# Patient Record
Sex: Male | Born: 1990 | Hispanic: No | Marital: Single
Health system: Midwestern US, Community
[De-identification: ages and names within clinical notes are randomized; demographics above are authoritative.]

## PROBLEM LIST (undated history)

## (undated) DIAGNOSIS — Z992 Dependence on renal dialysis: Secondary | ICD-10-CM

## (undated) DIAGNOSIS — I10 Essential (primary) hypertension: Secondary | ICD-10-CM

## (undated) DIAGNOSIS — N186 End stage renal disease: Secondary | ICD-10-CM

## (undated) DIAGNOSIS — T82868A Thrombosis of vascular prosthetic devices, implants and grafts, initial encounter: Secondary | ICD-10-CM

## (undated) DIAGNOSIS — I160 Hypertensive urgency: Secondary | ICD-10-CM

## (undated) DIAGNOSIS — T8612 Kidney transplant failure: Secondary | ICD-10-CM

---

## 2015-08-06 ENCOUNTER — Emergency Department: Payer: Medicare Other

## 2015-08-06 ENCOUNTER — Encounter: Payer: Self-pay | Admitting: Urgent Care

## 2015-08-06 ENCOUNTER — Emergency Department
Admission: EM | Admit: 2015-08-06 | Discharge: 2015-08-06 | Disposition: A | Discharge status: Home / Self Care | Payer: Medicare Other | Attending: Emergency Medicine | Admitting: Emergency Medicine

## 2015-08-06 DIAGNOSIS — N186 End stage renal disease: Secondary | ICD-10-CM | POA: Insufficient documentation

## 2015-08-06 DIAGNOSIS — I120 Hypertensive chronic kidney disease with stage 5 chronic kidney disease or end stage renal disease: Secondary | ICD-10-CM | POA: Insufficient documentation

## 2015-08-06 DIAGNOSIS — Z992 Dependence on renal dialysis: Secondary | ICD-10-CM | POA: Insufficient documentation

## 2015-08-06 DIAGNOSIS — R1033 Periumbilical pain: Secondary | ICD-10-CM | POA: Diagnosis present

## 2015-08-06 HISTORY — DX: Essential (primary) hypertension: I10

## 2015-08-06 HISTORY — DX: Dependence on renal dialysis: Z99.2

## 2015-08-06 HISTORY — DX: End stage renal disease: N18.6

## 2015-08-06 LAB — COMPREHENSIVE METABOLIC PANEL
ALT: 10 U/L — AB (ref 17–63)
ANION GAP: 12 (ref 5–15)
AST: 23 U/L (ref 15–41)
Albumin: 3.8 g/dL (ref 3.5–5.0)
Alkaline Phosphatase: 55 U/L (ref 38–126)
BUN: 42 mg/dL — ABNORMAL HIGH (ref 6–20)
CHLORIDE: 103 mmol/L (ref 101–111)
CO2: 23 mmol/L (ref 22–32)
CREATININE: 13.02 mg/dL — AB (ref 0.61–1.24)
Calcium: 8.9 mg/dL (ref 8.9–10.3)
GFR, EST AFRICAN AMERICAN: 5 mL/min — AB (ref >60)
GFR, EST NON AFRICAN AMERICAN: 5 mL/min — AB (ref >60)
Glucose, Bld: 93 mg/dL (ref 65–99)
Potassium: 5 mmol/L (ref 3.5–5.1)
SODIUM: 138 mmol/L (ref 135–145)
TOTAL PROTEIN: 7 g/dL (ref 6.5–8.1)
Total Bilirubin: 1 mg/dL (ref 0.3–1.2)

## 2015-08-06 LAB — CBC
HCT: 36.3 % — ABNORMAL LOW (ref 40.0–52.0)
Hemoglobin: 11.6 g/dL — ABNORMAL LOW (ref 13.0–18.0)
MCH: 30.2 pg (ref 26.0–34.0)
MCHC: 32.1 g/dL (ref 32.0–36.0)
MCV: 94.1 fL (ref 80.0–100.0)
PLATELETS: 208 10*3/uL (ref 150–440)
RBC: 3.85 MIL/uL — AB (ref 4.40–5.90)
RDW: 17.6 % — AB (ref 11.5–14.5)
WBC: 6.6 10*3/uL (ref 3.8–10.6)

## 2015-08-06 LAB — LIPASE, BLOOD: LIPASE: 25 U/L (ref 11–51)

## 2015-08-06 MED ORDER — MORPHINE SULFATE (PF) 4 MG/ML IV SOLN
INTRAVENOUS | Status: AC
  Filled 2015-08-06: qty 1

## 2015-08-06 MED ORDER — ONDANSETRON HCL 4 MG/2ML IJ SOLN
4.0000 mg | Freq: Once | INTRAMUSCULAR | Status: AC
  Administered 2015-08-06: 4 mg via INTRAVENOUS

## 2015-08-06 MED ORDER — ONDANSETRON HCL 4 MG/2ML IJ SOLN
INTRAMUSCULAR | Status: AC
  Filled 2015-08-06: qty 2

## 2015-08-06 MED ORDER — HYDROCODONE-ACETAMINOPHEN 5-325 MG PO TABS: 1.0000 | ORAL_TABLET | ORAL | Status: AC | PRN

## 2015-08-06 MED ORDER — MORPHINE SULFATE (PF) 4 MG/ML IV SOLN
4.0000 mg | Freq: Once | INTRAVENOUS | Status: AC
  Administered 2015-08-06: 4 mg via INTRAVENOUS

## 2015-08-06 MED ORDER — SIMETHICONE 80 MG PO CHEW: 80.0000 mg | CHEWABLE_TABLET | Freq: Four times a day (QID) | ORAL | Status: AC | PRN

## 2015-08-06 NOTE — ED Notes (Signed)
Patient transported to CT 

## 2015-08-06 NOTE — ED Provider Notes (Signed)
[facility] Emergency Department Provider Note  Time seen: 5:23 AM  I have reviewed the triage vital signs and the nursing notes.   HISTORY  Chief Complaint Abdominal Pain    HPI Jeff Joseph is a 25 y.o. male with a past medical history of hypertension, end-stage renal disease on hemodialysis Monday, Wednesday, Friday, presents the emergency department with abdominal pain. According to the patient for the past 3 days he has been experiencing periumbilical abdominal pain. Patient states he believes it's gas but it has not come away so he became concerned and came to the emergency department. Patient states he also felt like his blood pressure was elevated so he took his blood pressure medications just before arrival to the emergency department. Currently describes his pain as moderate and dull pain to the center of his abdomen denies nausea, vomiting, diarrhea. States he makes urine almost every day, denies dysuria. Denies fever.     Past Medical History  Diagnosis Date  . ESRD (end stage renal disease) on dialysis (HCC)   . Hemodialysis patient (HCC)     M,W,F  . Hypertension     There are no active problems to display for this patient.   History reviewed. No pertinent past surgical history.  No current outpatient prescriptions on file.  Allergies Review of patient's allergies indicates not on file.  No family history on file.  Social History Social History  Substance Use Topics  . Smoking status: Never Smoker   . Smokeless tobacco: None  . Alcohol Use: No    Review of Systems Constitutional: Negative for fever. Cardiovascular: Negative for chest pain. Respiratory: Negative for shortness of breath. Gastrointestinal: Positive for periumbilical abdominal pain Genitourinary: Negative for dysuria. Neurological: Negative for headache 10-point ROS otherwise negative.  ____________________________________________   PHYSICAL  EXAM:  VITAL SIGNS: ED Triage Vitals  Enc Vitals Group     BP 08/06/15 0510 200/136 mmHg     Pulse Rate 08/06/15 0510 86     Resp 08/06/15 0510 18     Temp 08/06/15 0510 98.6 F (37 C)     Temp Source 08/06/15 0510 Oral     SpO2 08/06/15 0510 100 %     Weight 08/06/15 0516 215 lb (97.523 kg)     Height 08/06/15 0516  (1.88 m)     Head Cir --      Peak Flow --      Pain Score 08/06/15 0510 8     Pain Loc --      Pain Edu? --      Excl. in GC? --     Constitutional: Alert and oriented. Well appearing and in no distress. Eyes: Normal exam ENT   Head: Normocephalic and atraumatic.   Mouth/Throat: Mucous membranes are moist. Cardiovascular: Normal rate, regular rhythm. No murmur Respiratory: Normal respiratory effort without tachypnea nor retractions. Breath sounds are clear  Gastrointestinal: Soft and nontender. No distention.   Musculoskeletal: Nontender with normal range of motion in all extremities.  Neurologic:  Normal speech and language. No gross focal neurologic deficits Skin:  Skin is warm, dry and intact.  Psychiatric: Mood and affect are normal.  ____________________________________________   RADIOLOGY  CT scan shows no acute abnormality.  ____________________________________________    INITIAL IMPRESSION / ASSESSMENT AND PLAN / ED COURSE  Pertinent labs & imaging results that were available during my care of the patient were reviewed by me and considered in my medical decision making (see chart for details).  Patient presents the emergency department periumbilical abdominal pain for the past 3 days. Patient states he believes his gas but has not gone away so he became concerned. On exam the patient appears well, no distress, no tenderness to palpation. We will check labs, and proceed with a CT abdomen/pelvis without contrast to further evaluate.  Labs are largely the patient's baseline. CT scan shows no acute abnormality. Patient's blood pressure  is decreasing. We will obtain a right upper quadrant ultrasound to rule out biliary disease. Patient resting comfortably at this time. Patient care signed out to Dr. Derrill KayGoodman.  ____________________________________________   FINAL CLINICAL IMPRESSION(S) / ED DIAGNOSES  Abdominal pain   Minna AntisKevin Guilford Shannahan, MD 08/06/15 [phone]

## 2015-08-06 NOTE — ED Notes (Signed)
MD Paduchowski at bedside  

## 2015-08-06 NOTE — ED Notes (Signed)
MD notified of patient's pain score and patient's BP. Pt has been consistently hypertensive since his arrival to the ED. Per MD pt is okay for D/C at this time. Pt's family at bedside to take patient home. NAD noted at time of D/C. Respirations even and unlabored at this time, skin warm, dry, and intact. Pt denies comments/concerns/questions at this time.

## 2015-08-06 NOTE — ED Notes (Signed)
Patient presents with c/o periumbilical pain since Thursday. Denies accompanying symptoms. Patient states, "It feels like gas, but I cant pass any so I dont know".

## 2015-08-06 NOTE — ED Notes (Signed)
Pt taken to US at this time

## 2015-08-06 NOTE — Discharge Instructions (Signed)

## 2015-08-06 NOTE — ED Notes (Signed)
Pt on MWF dialysis schedule. Fistula in left arm

## 2015-08-06 NOTE — ED Notes (Signed)
Pt returned from US

## 2015-08-06 NOTE — ED Provider Notes (Signed)
-----------------------------------------   8:06 AM on 08/06/2015 -----------------------------------------  Ultrasound returned without any acute findings. I went and reevaluated patient. He's denied any further complaints or concerns at this time. I did discuss plan of care and discuss return precautions. Will discharge with paperwork prepared by Dr. Lenard LancePaduchowski.  Jeff Joseph Lila Lufkin, MD 08/06/15 [phone]

## 2017-03-09 IMAGING — CT CT RENAL STONE PROTOCOL
3 of 4 series · 9 of 46 positions shown, 16 images · non-contrast
Comparison: None.

CLINICAL DATA: Central to right flank pain for 2 days.

EXAM:
CT ABDOMEN AND PELVIS WITHOUT CONTRAST
TECHNIQUE: Multidetector CT imaging of the abdomen and pelvis was performed
following the standard protocol without IV contrast.

[Series 4: lung · axial · 0.75mm/px · z∈[-188,-98]mm · 5 of 28 slices shown, 10 images]
[im 5/28  soft-tissue]
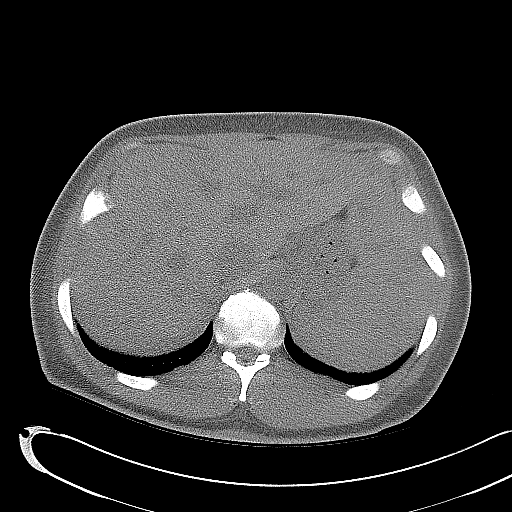
[im 5/28  bone]
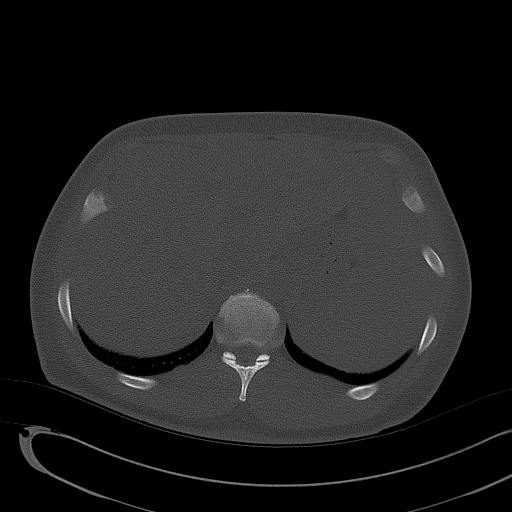
[im 10/28  soft-tissue]
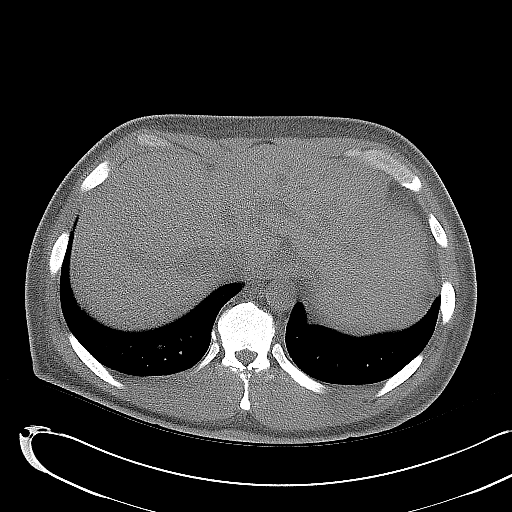
[im 10/28  lung]
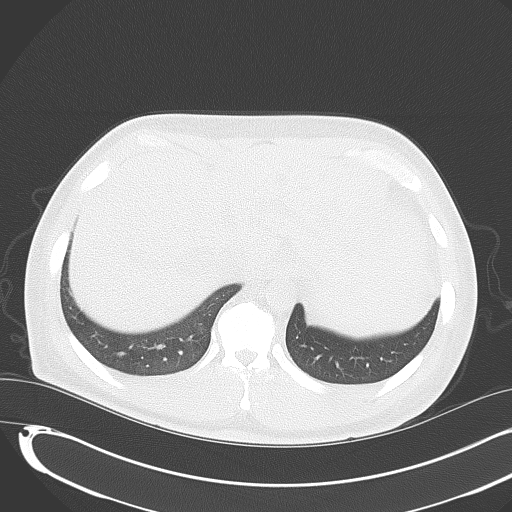
[im 14/28  soft-tissue]
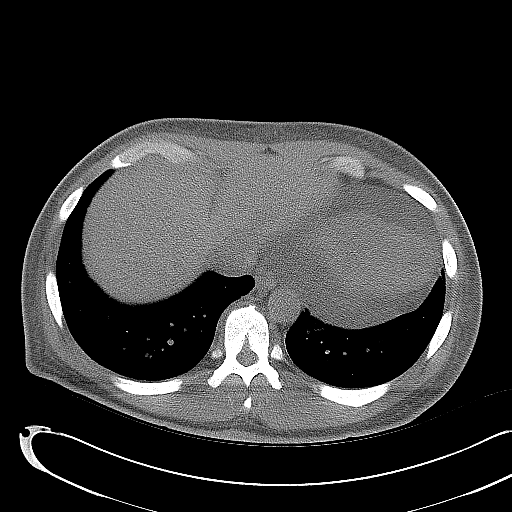
[im 14/28  lung]
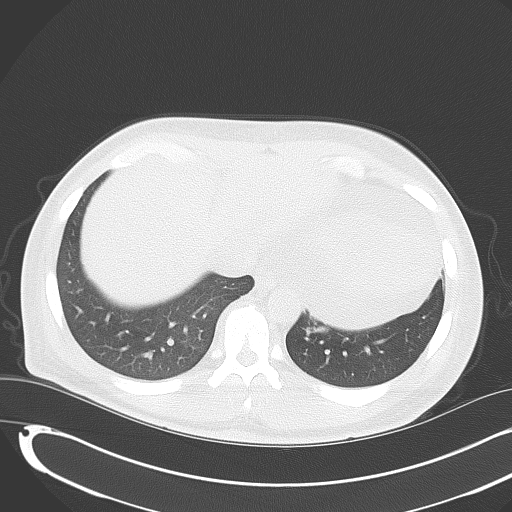
[im 19/28  soft-tissue]
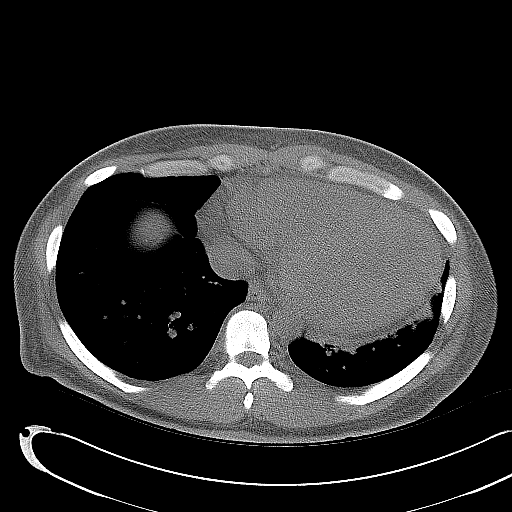
[im 19/28  lung]
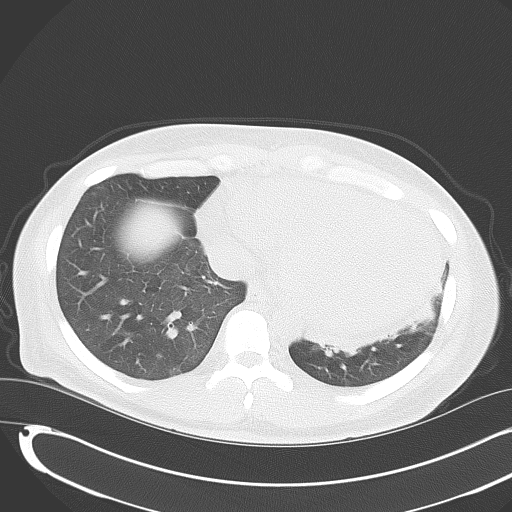
[im 23/28  soft-tissue]
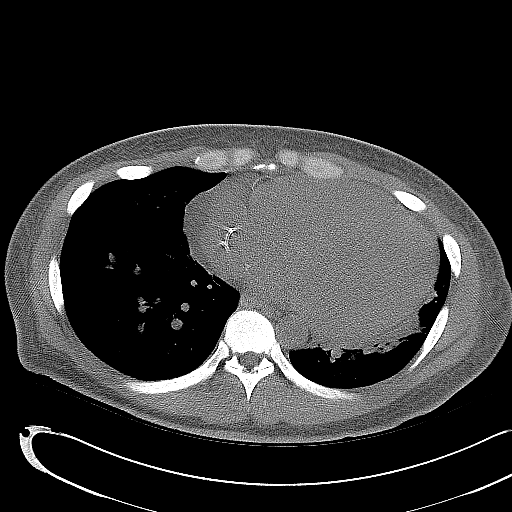
[im 23/28  lung]
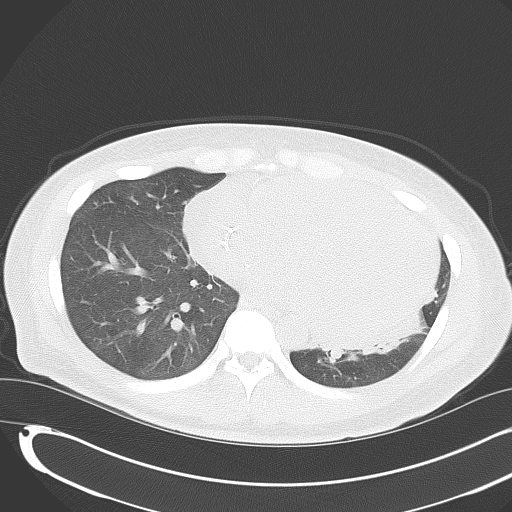

[Series 5: coronal · coronal · 0.79mm/px · 3 of 118 slices shown, 4 images]
[im 40/118  soft-tissue]
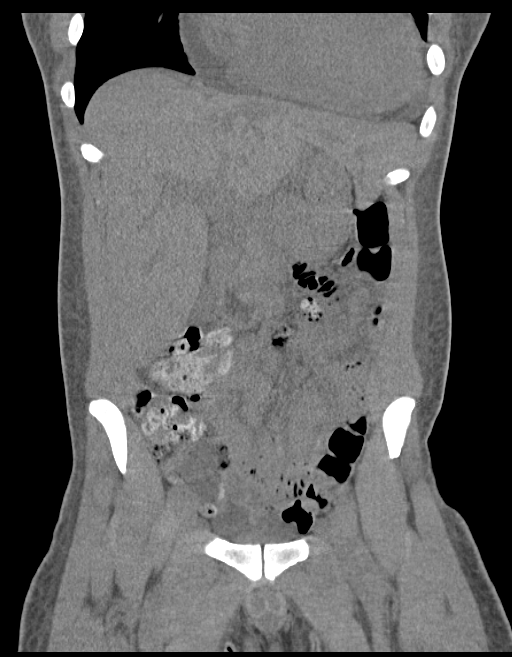
[im 53/118  soft-tissue]
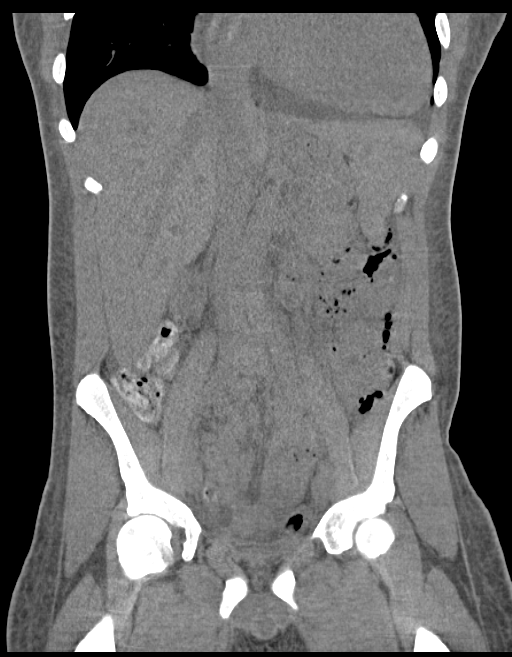
[im 53/118  bone]
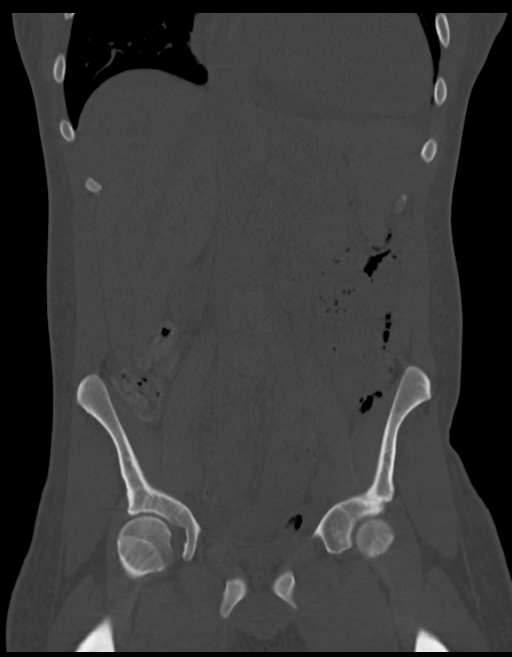
[im 66/118  soft-tissue]
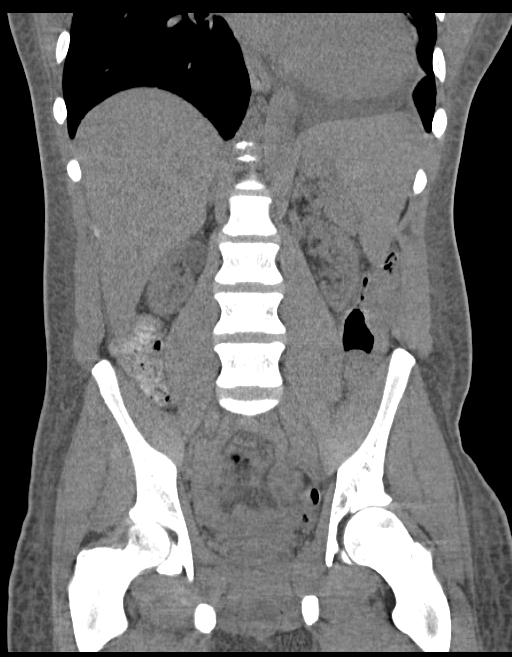

[Series 6: sagittal · sagittal · 0.65mm/px · 1 of 179 slices shown, 2 images]
[im 60/179  soft-tissue]
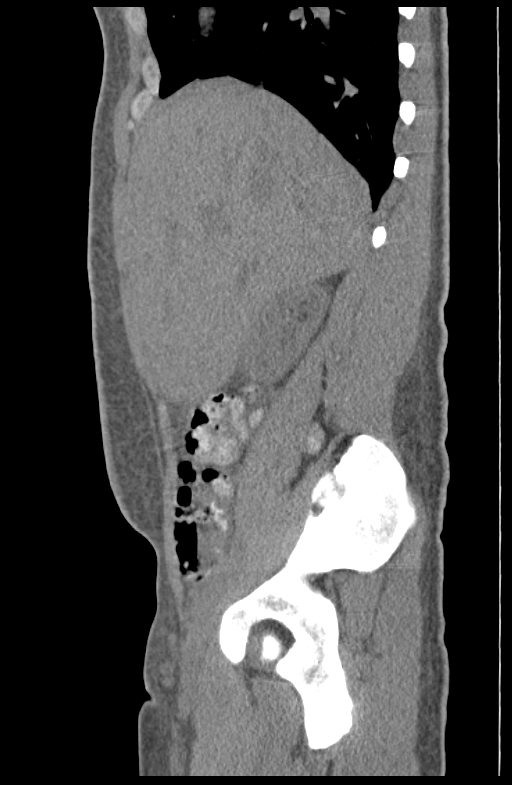
[im 60/179  bone]
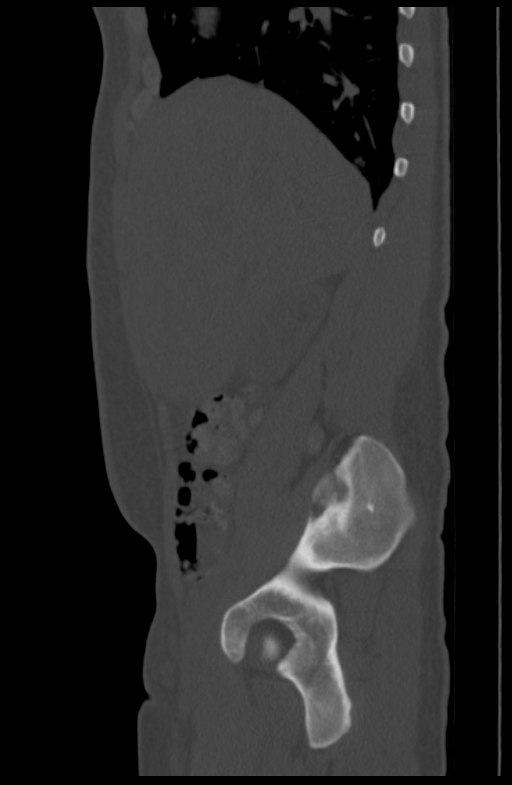

[9 of 46 positions shown; findings below may reference images not displayed]

FINDINGS: There is a pericardial effusion, incompletely imaged.

The liver is enlarged, nearly 24 cm craniocaudal. No focal liver
lesions are evident on this unenhanced scan. No bile duct dilatation
is evident.

There are unremarkable unenhanced appearances of the spleen,
pancreas and adrenals. The kidneys appear atrophic. No urinary
calculi are evident.

Stomach, small bowel and appendix appear normal. Colon is
unremarkable.

No focal inflammatory changes are evident in the abdomen or pelvis.
There is no extraluminal air.

There is a small volume ascites.

The abdominal aorta is normal in caliber without atherosclerotic
calcification.

No significant skeletal lesions are evident.
IMPRESSION: 1. Hepatomegaly.  Small volume ascites.
2. Kidneys appear atrophic. There probably are small renal cysts but
they are poorly visualized.
*Normal appendix.
*Pericardial effusion.
*Mild to moderate study limitations due to poor soft tissue contrast
due to ascites and lack of intravenous contrast.

## 2017-04-02 IMAGING — US US ABDOMEN LIMITED
1 series · 14 of 25 positions shown · non-contrast
Comparison: CT the abdomen and pelvis 08/06/2015.

CLINICAL DATA: 25-year-old male with history of right upper
quadrant abdominal pain for the past 3 days.

EXAM:
US ABDOMEN LIMITED - RIGHT UPPER QUADRANT

[Series 1: us abdomen limited · 0.17mm/px · 14 of 57 slices shown]
[im 1/57]
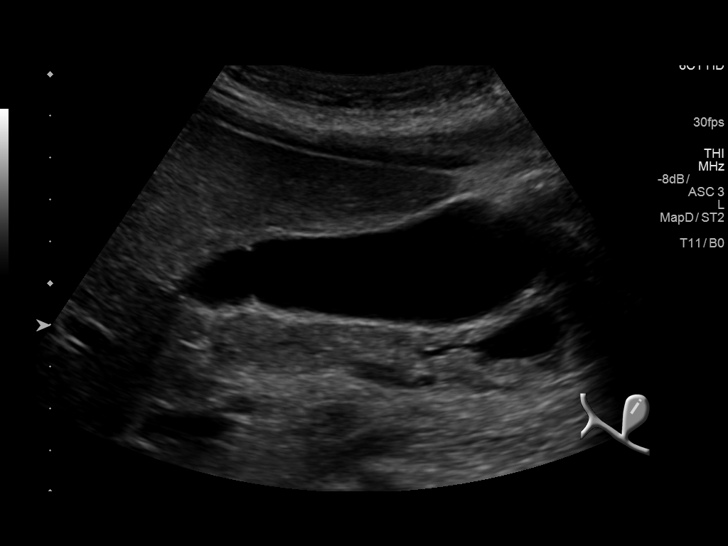
[im 5/57]
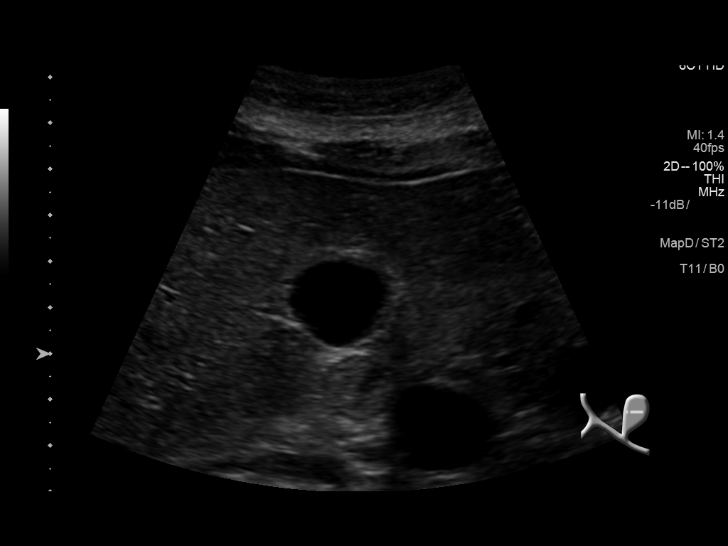
[im 10/57]
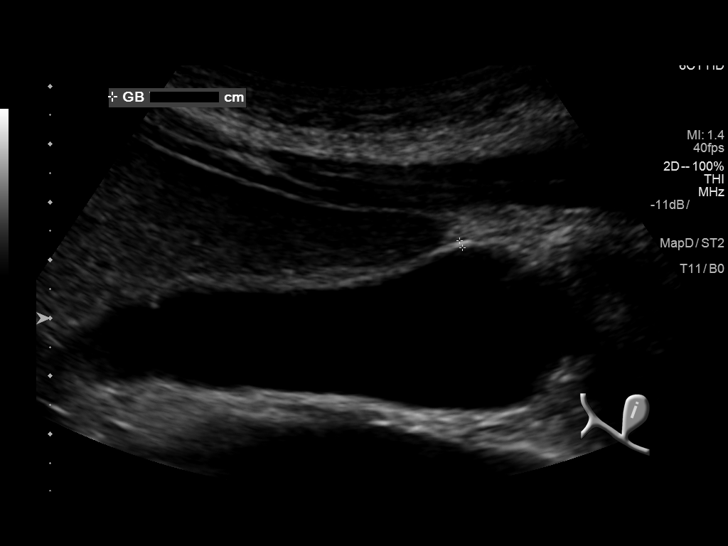
[im 15/57]
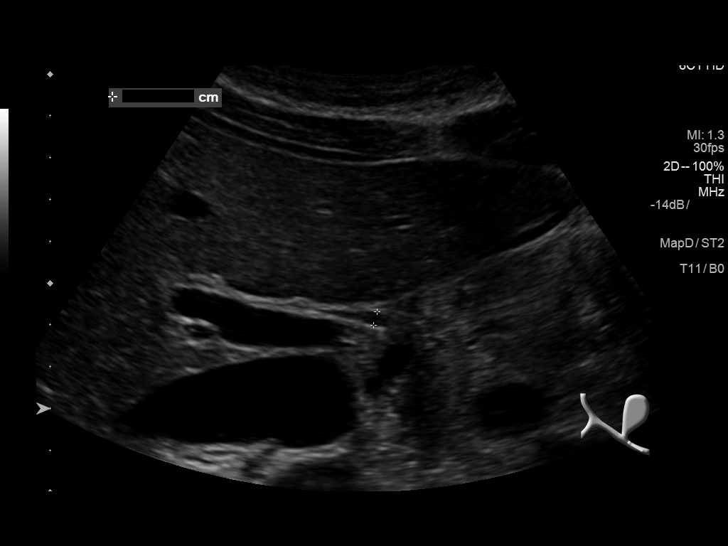
[im 19/57]
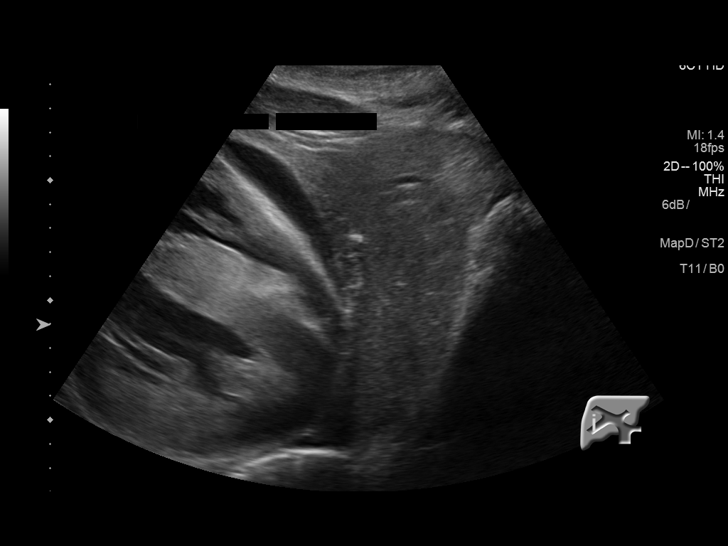
[im 22/57]
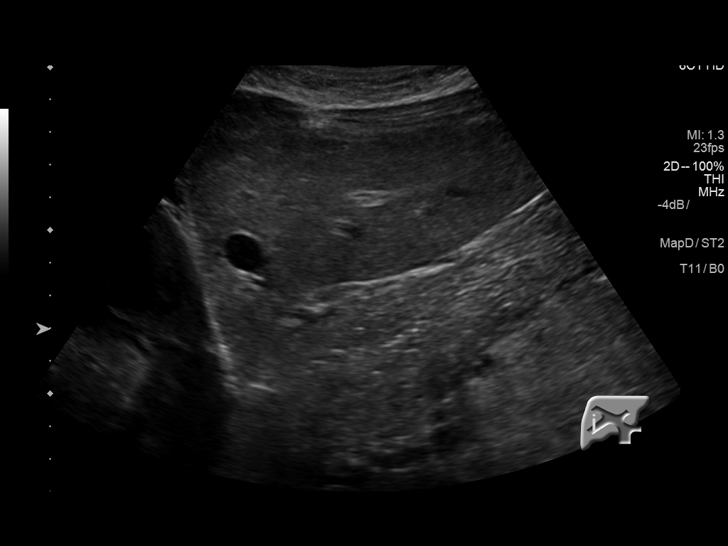
[im 26/57]
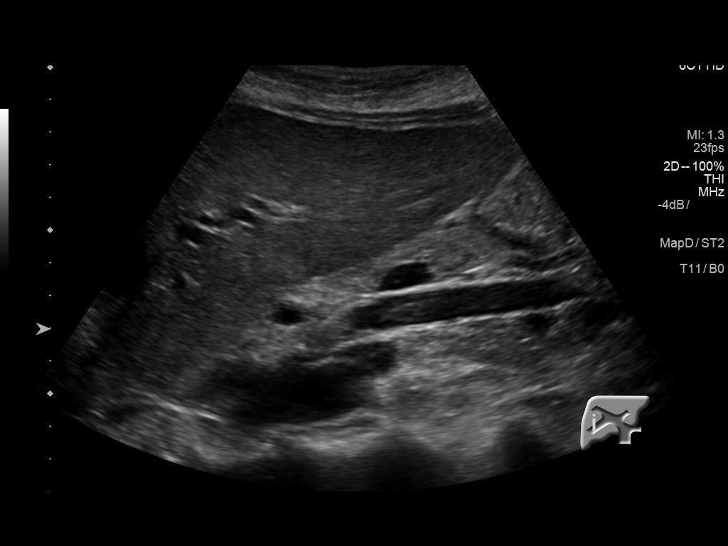
[im 31/57]
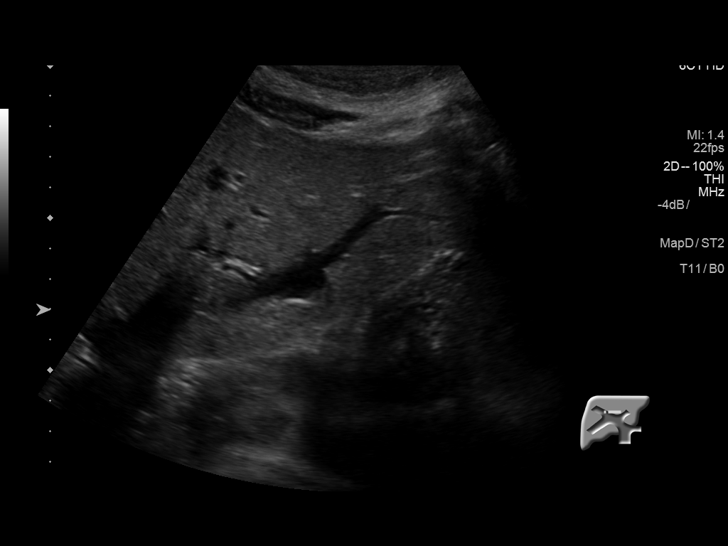
[im 36/57]
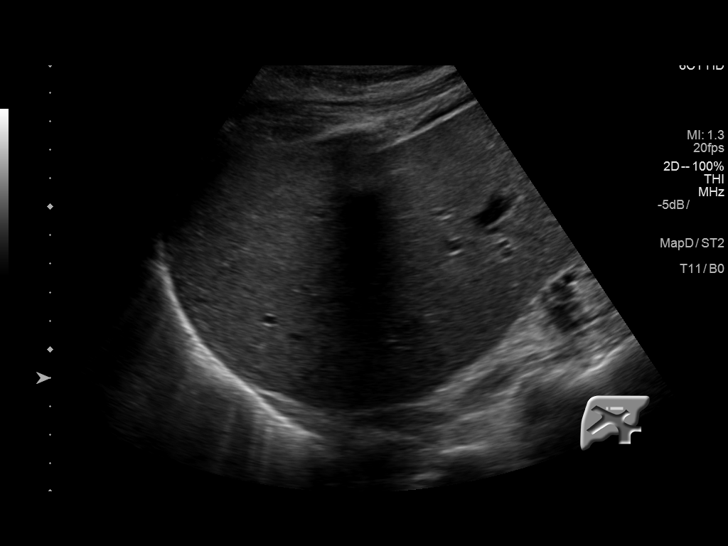
[im 38/57]
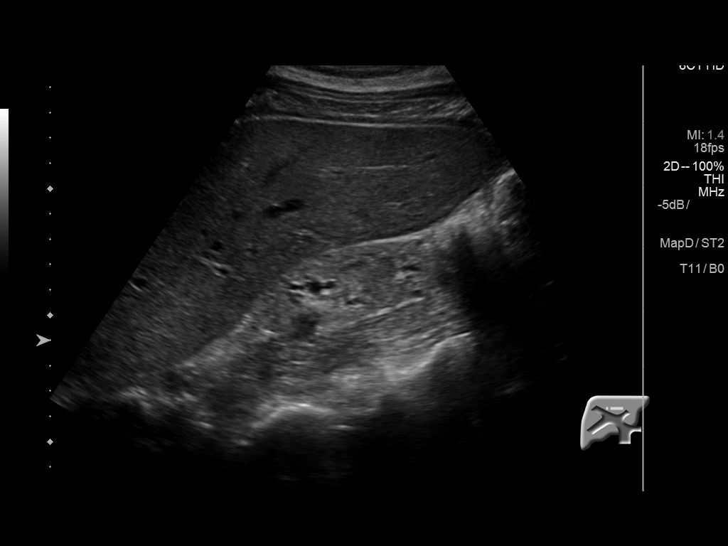
[im 43/57]
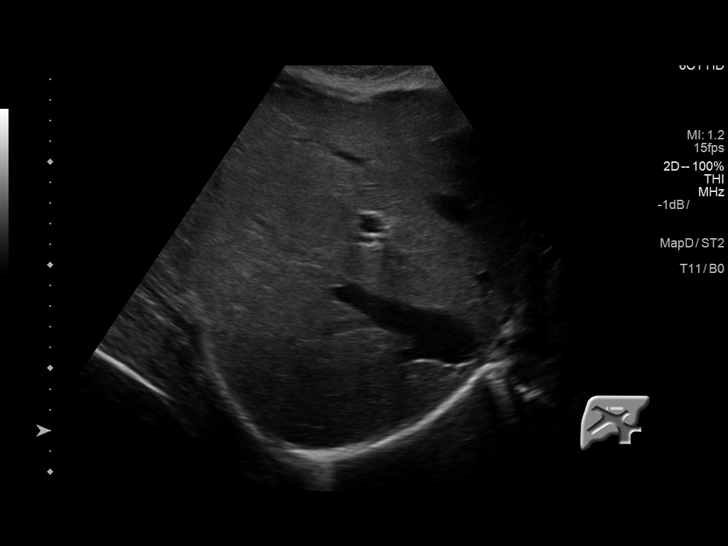
[im 47/57]
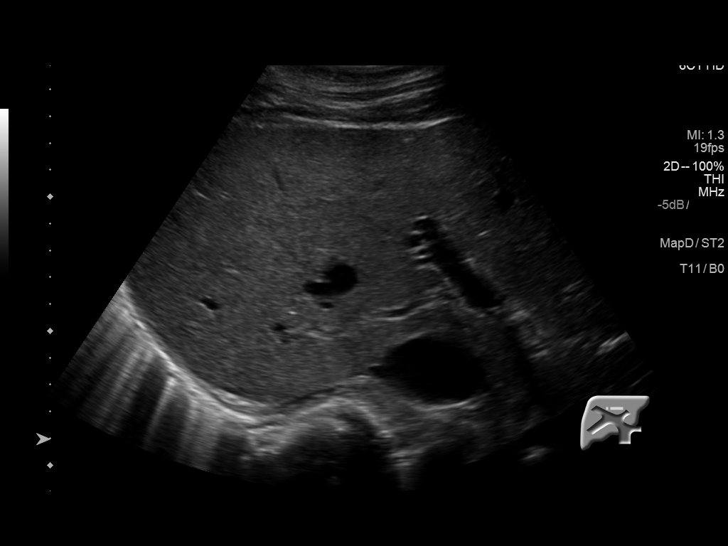
[im 52/57]
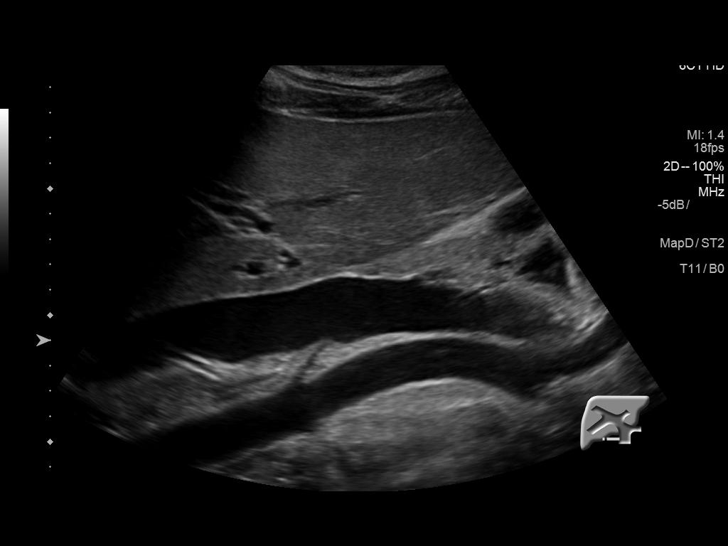
[im 57/57]
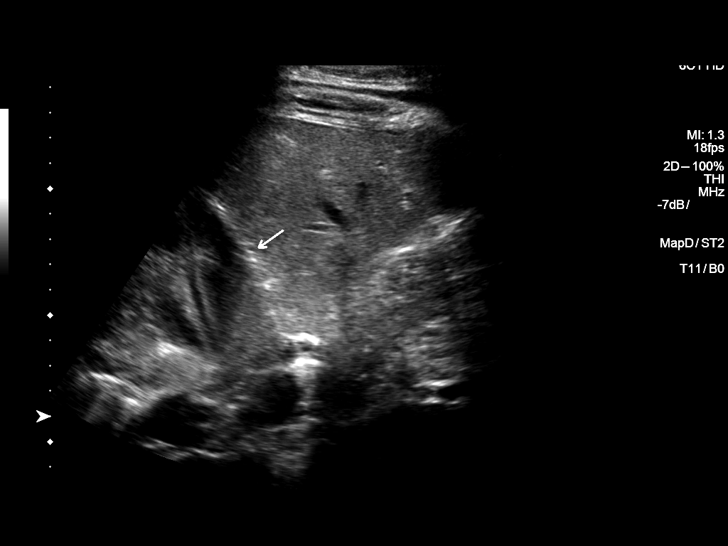

[14 of 25 positions shown; findings below may reference images not displayed]

FINDINGS: Gallbladder:

No gallstones or wall thickening visualized. No sonographic Murphy
sign noted by sonographer.

Common bile duct:

Diameter: 3.3 mm

Liver:

No focal lesion identified. Within normal limits in parenchymal
echogenicity.

Other:

Small volume of pericardial fluid. Right kidney is atrophic with
increased echogenicity throughout the renal parenchyma indicative of
underlying medical renal disease.
IMPRESSION: 1. No acute findings in the abdomen to account for the patient's
symptoms.
2. Small volume of pericardial fluid incidentally noted.
3. Incidentally imaged portions of the right kidney demonstrate
atrophy with increased echogenicity throughout the renal parenchyma,
indicative of underlying medical renal disease.

## 2018-06-24 ENCOUNTER — Ambulatory Visit: Primary: Internal Medicine

## 2018-06-29 NOTE — Anesthesia Pre-Procedure Evaluation (Signed)
Relevant Problems   No relevant active problems       Anesthetic History   No history of anesthetic complications            Review of Systems / Medical History  Patient summary reviewed, nursing notes reviewed and pertinent labs reviewed    Pulmonary          Smoker (former cigs; marijuana use)  Asthma : well controlled       Neuro/Psych              Cardiovascular    Hypertension: well controlled              Exercise tolerance: >4 METS  Comments: 03/2018 ECHO-increased LV systolic fx EF 78%, no WMA, severe concentric LVH, Grade I DD, Normal RV systolic fx, PAP 64PPIR   GI/Hepatic/Renal         Renal disease (MWF): ESRD       Endo/Other  Within defined limits           Other Findings            Physical Exam    Airway  Mallampati: II  TM Distance: 4 - 6 cm  Neck ROM: normal range of motion   Mouth opening: Normal     Cardiovascular  Regular rate and rhythm,  S1 and S2 normal,  no murmur, click, rub, or gallop             Dental  No notable dental hx       Pulmonary  Breath sounds clear to auscultation               Abdominal         Other Findings            Anesthetic Plan    ASA: 3  Anesthesia type: general          Induction: Intravenous  Anesthetic plan and risks discussed with: Patient

## 2018-06-29 NOTE — Anesthesia Pre-Procedure Evaluation (Addendum)
Relevant Problems   No relevant active problems       Anesthetic History   No history of anesthetic complications            Review of Systems / Medical History  Patient summary reviewed, nursing notes reviewed and pertinent labs reviewed    Pulmonary          Smoker (former cigs; marijuana use)  Asthma : well controlled       Neuro/Psych              Cardiovascular    Hypertension: well controlled              Exercise tolerance: >4 METS  Comments: 03/2018 ECHO-increased LV systolic fx EF 78%, no WMA, severe concentric LVH, Grade I DD, Normal RV systolic fx, PAP 22mmHg   GI/Hepatic/Renal         Renal disease (MWF): ESRD       Endo/Other  Within defined limits           Other Findings            Physical Exam    Airway  Mallampati: II  TM Distance: 4 - 6 cm  Neck ROM: normal range of motion   Mouth opening: Normal     Cardiovascular  Regular rate and rhythm,  S1 and S2 normal,  no murmur, click, rub, or gallop             Dental  No notable dental hx       Pulmonary  Breath sounds clear to auscultation               Abdominal         Other Findings            Anesthetic Plan    ASA: 3  Anesthesia type: general          Induction: Intravenous  Anesthetic plan and risks discussed with: Patient

## 2018-06-30 ENCOUNTER — Inpatient Hospital Stay: Payer: MEDICARE

## 2018-06-30 MED ORDER — SODIUM CHLORIDE 0.9 % IRRIGATION SOLN
0.9 % | Status: DC | PRN
Start: 2018-06-30 — End: 2018-06-30
  Administered 2018-06-30: 13:00:00

## 2018-06-30 MED ORDER — SODIUM CHLORIDE 0.9 % IV
100 mcg/mL | INTRAVENOUS | Status: DC | PRN
Start: 2018-06-30 — End: 2018-06-30
  Administered 2018-06-30: 13:00:00 via INTRAVENOUS

## 2018-06-30 MED ORDER — LIDOCAINE HCL 1 % (10 MG/ML) IJ SOLN
10 mg/mL (1 %) | Freq: Once | INTRAMUSCULAR | Status: DC | PRN
Start: 2018-06-30 — End: 2018-06-30

## 2018-06-30 MED ORDER — HYDRALAZINE 20 MG/ML IJ SOLN
20 mg/mL | INTRAMUSCULAR | Status: DC | PRN
Start: 2018-06-30 — End: 2018-06-30
  Administered 2018-06-30: 13:00:00 via INTRAVENOUS

## 2018-06-30 MED ORDER — DEXAMETHASONE SODIUM PHOSPHATE 10 MG/ML IJ SOLN
10 mg/mL | Freq: Once | INTRAMUSCULAR | Status: AC
Start: 2018-06-30 — End: 2018-06-30
  Administered 2018-06-30: 11:00:00 via INTRAVENOUS

## 2018-06-30 MED ORDER — HYDROCODONE-ACETAMINOPHEN 5 MG-325 MG TAB
5-325 mg | ORAL_TABLET | Freq: Four times a day (QID) | ORAL | 0 refills | Status: AC | PRN
Start: 2018-06-30 — End: 2018-07-03

## 2018-06-30 MED ORDER — NALOXONE 0.4 MG/ML INJECTION
0.4 mg/mL | INTRAMUSCULAR | Status: DC | PRN
Start: 2018-06-30 — End: 2018-06-30

## 2018-06-30 MED ORDER — FLUMAZENIL 0.1 MG/ML IV SOLN
0.1 mg/mL | INTRAVENOUS | Status: DC | PRN
Start: 2018-06-30 — End: 2018-06-30

## 2018-06-30 MED ORDER — HYDRALAZINE 20 MG/ML IJ SOLN
20 mg/mL | INTRAMUSCULAR | Status: AC
Start: 2018-06-30 — End: ?

## 2018-06-30 MED ORDER — FENTANYL CITRATE (PF) 50 MCG/ML IJ SOLN
50 mcg/mL | INTRAMUSCULAR | Status: DC | PRN
Start: 2018-06-30 — End: 2018-06-30

## 2018-06-30 MED ORDER — PROPOFOL 10 MG/ML IV EMUL
10 mg/mL | INTRAVENOUS | Status: DC | PRN
Start: 2018-06-30 — End: 2018-06-30
  Administered 2018-06-30 (×3): via INTRAVENOUS

## 2018-06-30 MED ORDER — GLYCOPYRROLATE 0.2 MG/ML IJ SOLN
0.2 mg/mL | Freq: Once | INTRAMUSCULAR | Status: AC
Start: 2018-06-30 — End: 2018-06-30
  Administered 2018-06-30: 11:00:00 via INTRAVENOUS

## 2018-06-30 MED ORDER — HYDROMORPHONE 1 MG/ML INJECTION SOLUTION
1 mg/mL | INTRAMUSCULAR | Status: DC | PRN
Start: 2018-06-30 — End: 2018-06-30

## 2018-06-30 MED ORDER — PHENYLEPHRINE 10 MG/ML INJECTION
10 mg/mL | INTRAMUSCULAR | Status: DC | PRN
Start: 2018-06-30 — End: 2018-06-30
  Administered 2018-06-30 (×2): via INTRAVENOUS

## 2018-06-30 MED ORDER — BACTERIOSTATIC SALINE 0.9 % IJ SOLN
0.9 % | INTRAMUSCULAR | Status: AC
Start: 2018-06-30 — End: ?

## 2018-06-30 MED ORDER — ONDANSETRON (PF) 4 MG/2 ML INJECTION
4 mg/2 mL | INTRAMUSCULAR | Status: AC
Start: 2018-06-30 — End: ?

## 2018-06-30 MED ORDER — HEPARIN (PORCINE) 1,000 UNIT/ML IJ SOLN
1000 unit/mL | INTRAMUSCULAR | Status: AC
Start: 2018-06-30 — End: ?

## 2018-06-30 MED ORDER — HEPARIN (PORCINE) 1,000 UNIT/ML IJ SOLN
1000 unit/mL | INTRAMUSCULAR | Status: DC | PRN
Start: 2018-06-30 — End: 2018-06-30
  Administered 2018-06-30: 13:00:00 via INTRAVENOUS

## 2018-06-30 MED ORDER — PROPOFOL 10 MG/ML IV EMUL
10 mg/mL | INTRAVENOUS | Status: AC
Start: 2018-06-30 — End: ?

## 2018-06-30 MED ORDER — HYDRALAZINE 20 MG/ML IJ SOLN
20 mg/mL | INTRAMUSCULAR | Status: DC | PRN
Start: 2018-06-30 — End: 2018-06-30

## 2018-06-30 MED ORDER — DEXMEDETOMIDINE 100 MCG/ML IV SOLN
100 mcg/mL | INTRAVENOUS | Status: AC
Start: 2018-06-30 — End: ?

## 2018-06-30 MED ORDER — THROMBIN (BOVINE) 5,000 UNIT TOPICAL SOLUTION
5000 unit | CUTANEOUS | Status: AC
Start: 2018-06-30 — End: ?

## 2018-06-30 MED ORDER — LABETALOL 5 MG/ML IV SOLN
5 mg/mL | INTRAVENOUS | Status: DC | PRN
Start: 2018-06-30 — End: 2018-06-30
  Administered 2018-06-30 (×6): via INTRAVENOUS

## 2018-06-30 MED ORDER — BUPIVACAINE (PF) 0.5 % (5 MG/ML) IJ SOLN
0.5 % (5 mg/mL) | INTRAMUSCULAR | Status: AC
Start: 2018-06-30 — End: ?

## 2018-06-30 MED ORDER — SODIUM CHLORIDE 0.9 % IJ SYRG
INTRAMUSCULAR | Status: AC
Start: 2018-06-30 — End: ?

## 2018-06-30 MED ORDER — FENTANYL CITRATE (PF) 50 MCG/ML IJ SOLN
50 mcg/mL | INTRAMUSCULAR | Status: DC | PRN
Start: 2018-06-30 — End: 2018-06-30
  Administered 2018-06-30 (×2): via INTRAVENOUS

## 2018-06-30 MED ORDER — ONDANSETRON (PF) 4 MG/2 ML INJECTION
4 mg/2 mL | INTRAMUSCULAR | Status: DC | PRN
Start: 2018-06-30 — End: 2018-06-30
  Administered 2018-06-30: 14:00:00 via INTRAVENOUS

## 2018-06-30 MED ORDER — HEPARIN (PORCINE) 1,000 UNIT/ML IJ SOLN
1000 unit/mL | INTRAMUSCULAR | Status: DC | PRN
Start: 2018-06-30 — End: 2018-06-30
  Administered 2018-06-30: 13:00:00

## 2018-06-30 MED ORDER — MIDAZOLAM 1 MG/ML IJ SOLN
1 mg/mL | INTRAMUSCULAR | Status: AC
Start: 2018-06-30 — End: ?

## 2018-06-30 MED ORDER — MIDAZOLAM 1 MG/ML IJ SOLN
1 mg/mL | INTRAMUSCULAR | Status: DC | PRN
Start: 2018-06-30 — End: 2018-06-30
  Administered 2018-06-30: 12:00:00 via INTRAVENOUS

## 2018-06-30 MED ORDER — LIDOCAINE (PF) 5 MG/ML (0.5 %) IJ SOLN
5 mg/mL (0. %) | INTRAMUSCULAR | Status: DC | PRN
Start: 2018-06-30 — End: 2018-06-30
  Administered 2018-06-30: 13:00:00 via INTRAVENOUS

## 2018-06-30 MED ORDER — LABETALOL 5 MG/ML IV SOLN
5 mg/mL | INTRAVENOUS | Status: DC | PRN
Start: 2018-06-30 — End: 2018-06-30

## 2018-06-30 MED ORDER — LABETALOL 5 MG/ML IV SOLN
5 mg/mL | INTRAVENOUS | Status: AC
Start: 2018-06-30 — End: ?

## 2018-06-30 MED ORDER — SODIUM CHLORIDE 0.9 % IV
INTRAVENOUS | Status: DC | PRN
Start: 2018-06-30 — End: 2018-06-30
  Administered 2018-06-30: 13:00:00 via INTRAVENOUS

## 2018-06-30 MED ORDER — OCTYL 2-CYANOACRYLATE TOPICAL LIQUID
CUTANEOUS | Status: AC
Start: 2018-06-30 — End: ?

## 2018-06-30 MED ORDER — LIDOCAINE HCL 1 % (10 MG/ML) IJ SOLN
10 mg/mL (1 %) | INTRAMUSCULAR | Status: AC
Start: 2018-06-30 — End: ?

## 2018-06-30 MED ORDER — FENTANYL CITRATE (PF) 50 MCG/ML IJ SOLN
50 mcg/mL | INTRAMUSCULAR | Status: AC
Start: 2018-06-30 — End: ?

## 2018-06-30 MED ORDER — BACITRACIN 50,000 UNIT IM
50000 unit | INTRAMUSCULAR | Status: AC
Start: 2018-06-30 — End: ?

## 2018-06-30 MED ORDER — SODIUM CHLORIDE 0.9 % IJ SYRG
INTRAMUSCULAR | Status: DC | PRN
Start: 2018-06-30 — End: 2018-06-30

## 2018-06-30 MED ORDER — DIPHENHYDRAMINE HCL 50 MG/ML IJ SOLN
50 mg/mL | Freq: Once | INTRAMUSCULAR | Status: DC | PRN
Start: 2018-06-30 — End: 2018-06-30

## 2018-06-30 MED ORDER — CEFAZOLIN 2 GRAM/50 ML NS IVPB
INTRAVENOUS | Status: AC
Start: 2018-06-30 — End: 2018-06-30
  Administered 2018-06-30: 13:00:00 via INTRAVENOUS

## 2018-06-30 MED FILL — OCTYLSEAL TOPICAL LIQUID: CUTANEOUS | Qty: 6

## 2018-06-30 MED FILL — BACITRACIN 50,000 UNIT IM: 50000 unit | INTRAMUSCULAR | Qty: 50000

## 2018-06-30 MED FILL — ONDANSETRON (PF) 4 MG/2 ML INJECTION: 4 mg/2 mL | INTRAMUSCULAR | Qty: 2

## 2018-06-30 MED FILL — HEPARIN (PORCINE) 1,000 UNIT/ML IJ SOLN: 1000 unit/mL | INTRAMUSCULAR | Qty: 2

## 2018-06-30 MED FILL — BUPIVACAINE (PF) 0.5 % (5 MG/ML) IJ SOLN: 0.5 % (5 mg/mL) | INTRAMUSCULAR | Qty: 30

## 2018-06-30 MED FILL — DEXAMETHASONE SODIUM PHOSPHATE 10 MG/ML IJ SOLN: 10 mg/mL | INTRAMUSCULAR | Qty: 1

## 2018-06-30 MED FILL — FENTANYL CITRATE (PF) 50 MCG/ML IJ SOLN: 50 mcg/mL | INTRAMUSCULAR | Qty: 2

## 2018-06-30 MED FILL — LABETALOL 5 MG/ML IV SOLN: 5 mg/mL | INTRAVENOUS | Qty: 20

## 2018-06-30 MED FILL — HYDRALAZINE 20 MG/ML IJ SOLN: 20 mg/mL | INTRAMUSCULAR | Qty: 1

## 2018-06-30 MED FILL — DEXMEDETOMIDINE 100 MCG/ML IV SOLN: 100 mcg/mL | INTRAVENOUS | Qty: 2

## 2018-06-30 MED FILL — BACTERIOSTATIC SALINE 0.9 % IJ SOLN: 0.9 % | INTRAMUSCULAR | Qty: 30

## 2018-06-30 MED FILL — DIPRIVAN 10 MG/ML INTRAVENOUS EMULSION: 10 mg/mL | INTRAVENOUS | Qty: 40

## 2018-06-30 MED FILL — THROMBIN-JMI 5,000 UNIT TOPICAL SOLUTION: 5000 unit | CUTANEOUS | Qty: 5000

## 2018-06-30 MED FILL — BD POSIFLUSH NORMAL SALINE 0.9 % INJECTION SYRINGE: INTRAMUSCULAR | Qty: 10

## 2018-06-30 MED FILL — MIDAZOLAM 1 MG/ML IJ SOLN: 1 mg/mL | INTRAMUSCULAR | Qty: 2

## 2018-06-30 MED FILL — GLYCOPYRROLATE 0.2 MG/ML IJ SOLN: 0.2 mg/mL | INTRAMUSCULAR | Qty: 1

## 2018-06-30 MED FILL — LIDOCAINE HCL 1 % (10 MG/ML) IJ SOLN: 10 mg/mL (1 %) | INTRAMUSCULAR | Qty: 40

## 2018-06-30 MED FILL — CEFAZOLIN 2 GRAM/50 ML NS IVPB: INTRAVENOUS | Qty: 50

## 2018-06-30 NOTE — Progress Notes (Signed)
Instructions given to mother      06/30/18 1043   Family Communication   Family Update Message Patient stable   Delivery Origin Nurse   Contact Person Relationship to Patient Parent   Family/Significant Other Update Called;Updated

## 2018-06-30 NOTE — Op Note (Signed)
Operative Note     Patient Name: Mark Castillo  MRN: [id number]  Date of Service: 06/30/2018       Attending:  Charlane Ferretti III, MD      PREOPERATIVE DIAGNOSES  1. End-stage renal disease.  2. Acutely Thrombosed LUE AVG  3. Need for long term dialysis access        POSTOPERATIVE DIAGNOSES  1. same        OPERATIVE PROCEDURE  Creation of RUE Brachio-Cephalic arteriovenous fistula  Balloon angioplasty of RUE cephalic vein - 4 mm diameter balloon     INDICATIONS FOR THE PROCEDURE  This patient has end-stage renal disease and is in need of permanent dialysis access.  The RUE was deemed to be the most suitable extremity due to anatomic factors following fistula mapping in pre op.     OPERATIVE FINDINGS  Successful RUE brachiocephalic AVF creation with good post op thrill and distal perfusion     OPERATIVE PROCEDURE IN DETAIL  After informed consent was obtained, the patient was taken to the operating room. The patient was placed in the supine position.  General anesthesia was induced without issue.  The relevant arteriovenous anatomy was examined with live B mode ultrasound and marked on the skin.  The Rt arm was prepped and draped in the usual sterile fashion.     A small transverse incision was made at the [facility] fossa. The cephalic vein was identified and mobilized. Side branches were ligated with silk ligature.  The brachial artery was then exposed and mobilized.  The vein was ligated distally and divided, then hep saline was instilled to pneumatically dilate the vein.  There were no leaks or twists.  The vein was of adequate quality but marginal caliber.  Given this, a 4 mm angioplasty balloon was brought onto the field and balloon angioplasty to profile using an insufflator was performed from the cephalic arch to the transected end at the [facility] fossa with excellent result and significant improvement in caliber.  Systemic heparin was given and after time for circulation, the artery was clamped, an arteriotomy made, and an  end to side anastomosis fashioned with fine prolene.  Air and debris was flushed to the outside and the clamps were removed sequentially and flow restored.  An excellent thrill was noted, and doppler signals were present to the palmar arch.  Hemostasis was ensured, the wound was closed in layers with absorbable suture, and sterile dressings were applied.     The patient tolerated the procedure well.  The instrument count was correct.  The patient was then transferred to the recovery room in satisfactory condition. A great thrill was felt in the fistula completion. There was also a palpable radial pulse distally.      Mickle Mallory III, MD was present for the entire procedure.      Charlane Ferretti III, MD  06/30/2018  10:18 AM

## 2018-06-30 NOTE — Anesthesia Post-Procedure Evaluation (Signed)
Procedure(s):  RIGHT ARM ARTERIOVENOUS GRAFT REVISION WITH ANEURYSM PLICATION.    general    Anesthesia Post Evaluation      Multimodal analgesia: multimodal analgesia used between 6 hours prior to anesthesia start to PACU discharge  Patient location during evaluation: bedside  Patient participation: waiting for patient participation  Pain score: 0  Pain management: adequate  Airway patency: patent  Anesthetic complications: no  Cardiovascular status: acceptable  Respiratory status: acceptable  Hydration status: acceptable        Vitals Value Taken Time   BP 154/68 06/30/2018 10:32 AM   Temp 36.7 ??C (98 ??F) 06/30/2018 10:32 AM   Pulse 80 06/30/2018 10:32 AM   Resp 22 06/30/2018 10:32 AM   SpO2 99 % 06/30/2018 10:32 AM

## 2018-06-30 NOTE — H&P (Signed)
H&P by Dana Allan,  PA at 06/30/18 0745                Author: Dana Allan, PA  Service: Physician Assistant  Author Type: Physician Assistant       Filed: 06/30/18 0828  Date of Service: 06/30/18 0745  Status: Signed           Editor: Dana Allan, PA (Physician Assistant)  Cosigner: Brenton Grills, MD at 07/01/18 1415                    [facility]   [address]   [place], [place] [address]   Dr. Coralee Rud, Dr. Aldona Bar, & Dr. Exie Parody, III   [phone] FAX# [phone]      History and Physical          Patient: Mark Castillo  MRN: [id number]   SSN: [id number]          Date of Birth: 1991/01/29   Age: 28 y.o.   Sex: male           Subjective:         Mark Castillo is a 28 y.o. male who presents for left upper arm AVG (placed 2017) thrombectomy and pseudoaneurysm plication versus AVf formation. AVG thrombosed  06/26/2018. Rt IJ TDC inserted 06/26/2018 and last dialysis was 06/29/2018. States that he has been feeling well without recent illness and TDC has been working well for HD. Will get vein mapping before surgery.        Past Medical History:        Diagnosis  Date         ?  Asthma            childhood         ?  ESRF (end stage renal failure) (HCC)            DIALYSIS MWF- [facility]         ?  Hypertension            Past Surgical History:         Procedure  Laterality  Date          ?  HX UROLOGICAL              KIDNEY BX          ?  VASCULAR SURGERY PROCEDURE UNLIST              AVF           Family History         Problem  Relation  Age of Onset          ?  Arthritis  Mother            ?  Heart Attack  Father            Social History          Tobacco Use         ?  Smoking status:  Former Smoker     ?  Smokeless tobacco:  Never Used       Substance Use Topics         ?  Alcohol use:  Not Currently           Prior to Admission medications             Medication  Sig  Start Date  End  Date  Taking?  Authorizing  Provider            minoxidiL (LONITEN) 2.5 mg tablet  Take 2.5 mg by mouth two (2) times a day.      Yes  Provider, Historical     cloNIDine HCL (CATAPRES) 0.3 mg tablet  Take 0.3 mg by mouth two (2) times a day.      Yes  Provider, Historical     carvediloL (COREG) 25 mg tablet  Take 25 mg by mouth two (2) times a day.      Yes  Provider, Historical     sucroferric oxyhydroxide (Velphoro) 500 mg chew chewable tablet  Take  by mouth three (3) times daily (with meals).      Yes  Provider, Historical            hydrALAZINE (APRESOLINE) 100 mg tablet  Take  by mouth two (2) times a day. PT NOT SURE   OF DOSE; DOSE NOTED OBTAINED FROM PTS PHARMACY      Yes  Provider, Historical              Allergies        Allergen  Reactions         ?  Renvela [Sevelamer Carbonate]  Other (comments)             Swelling of lips and face (powdered version)           Review of Systems:    Constitutional: denies fatigue, fever, chills, weight loss   Eyes: denies redness, change in vision   Ears, Nose, Mouth, Throat, and Face: denies sore throat, congestion, hearing loss   Respiratory: denies shortness of breath, cough or dyspnea   Cardiovascular: denies chest pain, dizziness    Gastrointestinal: denies abdominal pain, nausea, diarrhea or vomiting   Musculoskeletal:denies muscles aches, cramps   Neurological: denies numbness, tingling in extremities        Objective:          Vitals:            06/24/18 0919  06/30/18 0617  06/30/18 0651          BP:    147/87       Pulse:    (!) 102       Resp:    20       Temp:    98.2 ??F (36.8 ??C)       SpO2:    100%       Weight:  95.3 kg (210 lb)  94.2 kg (207 lb 10.8 oz)  94.2 kg (207 lb 10.8 oz)          Height:   (1.88 m)     (1.88 m)            Physical Exam:   GENERAL: alert, cooperative, no distress, appears stated age   CV: RRR, no murmur, rub or gallop   RESP: CTAB, no wheeze   GI: Active bowel sounds, no TTP   PULSES: BL radial palpable, BL DP palpable   EXT: no swelling, numbness  or tingling in BUE      PVL 06/30/2018 Rt Vein Mapping:   Cephalic: dist Forearm 0.27cm, mid upper arm 0.27, axilla 0.31.   Basilic: dist 0.39, mid 0.4, upper bicep 0.58   Brachial A triphasic        Assessment:        ESRD dependent on Dialysis with funtioning Rt  IJ TDC        Plan:        Vein mapping demonstrates adequate Rt cephalic vein and brachial artery for AVF. Discussed AVF formation with patient versus thrombectomy of Lt AVG including procedure, risks, benefits, outcomes and follow up. Patient agreed to proceed with AVF formation  and address possible removal of aneurysm of AVG in future on elective basis.          Signed By:  Dana AllanJessica , PA           June 30, 2018

## 2018-06-30 NOTE — Progress Notes (Signed)
Instructions given to mother      06/30/18 1043   Family Communication   Family Update Message Patient stable   Delivery Origin Nurse   Contact Person Relationship to Patient Parent   Family/Significant Other Update Called;Updated

## 2018-06-30 NOTE — Op Note (Signed)
Operative Note     Patient Name: Mark Castillo  MRN: [id number]  Date of Service: 06/30/2018       Attending:  Charlane Ferretti III, MD      PREOPERATIVE DIAGNOSES  1. End-stage renal disease.  2. Acutely Thrombosed LUE AVG  3. Need for long term dialysis access        POSTOPERATIVE DIAGNOSES  1. same        OPERATIVE PROCEDURE  Creation of RUE Brachio-Cephalic arteriovenous fistula  Balloon angioplasty of RUE cephalic vein - 4 mm diameter balloon     INDICATIONS FOR THE PROCEDURE  This patient has end-stage renal disease and is in need of permanent dialysis access.  The RUE was deemed to be the most suitable extremity due to anatomic factors following fistula mapping in pre op.     OPERATIVE FINDINGS  Successful RUE brachiocephalic AVF creation with good post op thrill and distal perfusion     OPERATIVE PROCEDURE IN DETAIL  After informed consent was obtained, the patient was taken to the operating room. The patient was placed in the supine position.  General anesthesia was induced without issue.  The relevant arteriovenous anatomy was examined with live B mode ultrasound and marked on the skin.  The Rt arm was prepped and draped in the usual sterile fashion.     A small transverse incision was made at the [facility] fossa. The cephalic vein was identified and mobilized. Side branches were ligated with silk ligature.  The brachial artery was then exposed and mobilized.  The vein was ligated distally and divided, then hep saline was instilled to pneumatically dilate the vein.  There were no leaks or twists.  The vein was of adequate quality but marginal caliber.  Given this, a 4 mm angioplasty balloon was brought onto the field and balloon angioplasty to profile using an insufflator was performed from the cephalic arch to the transected end at the [facility] fossa with excellent result and significant improvement in caliber.  Systemic heparin was given and after time for  circulation, the artery was clamped, an arteriotomy made, and an end to side anastomosis fashioned with fine prolene.  Air and debris was flushed to the outside and the clamps were removed sequentially and flow restored.  An excellent thrill was noted, and doppler signals were present to the palmar arch.  Hemostasis was ensured, the wound was closed in layers with absorbable suture, and sterile dressings were applied.     The patient tolerated the procedure well.  The instrument count was correct.  The patient was then transferred to the recovery room in satisfactory condition. A great thrill was felt in the fistula completion. There was also a palpable radial pulse distally.      Mickle Mallory III, MD was present for the entire procedure.      Charlane Ferretti III, MD  06/30/2018  10:18 AM

## 2018-06-30 NOTE — Anesthesia Post-Procedure Evaluation (Signed)
Procedure(s):  RIGHT ARM ARTERIOVENOUS GRAFT REVISION WITH ANEURYSM PLICATION.    general    Anesthesia Post Evaluation      Multimodal analgesia: multimodal analgesia used between 6 hours prior to anesthesia start to PACU discharge  Patient location during evaluation: bedside  Patient participation: waiting for patient participation  Pain score: 0  Pain management: adequate  Airway patency: patent  Anesthetic complications: no  Cardiovascular status: acceptable  Respiratory status: acceptable  Hydration status: acceptable        Vitals Value Taken Time   BP 154/68 06/30/2018 10:32 AM   Temp 36.7 ??C (98 ??F) 06/30/2018 10:32 AM   Pulse 80 06/30/2018 10:32 AM   Resp 22 06/30/2018 10:32 AM   SpO2 99 % 06/30/2018 10:32 AM

## 2018-06-30 NOTE — H&P (Signed)
[facility]  [address]  [place], [place] [address]  Dr. Coralee Rud, Dr. Aldona Bar, & Dr. Exie Parody, III  [phone] FAX# [phone]    History and Physical    Patient: Mark Castillo MRN: [id number]  SSN: [id number]    Date of Birth: [birth year]  Age: 28 y.o.  Sex: male      Subjective:      Mark Castillo is a 28 y.o. male who presents for left upper arm AVG (placed 2017) thrombectomy and pseudoaneurysm plication versus AVf formation. AVG thrombosed 06/26/2018. Rt IJ TDC inserted 06/26/2018 and last dialysis was 06/29/2018. States that he has been feeling well without recent illness and TDC has been working well for HD. Will get vein mapping before surgery.    Past Medical History:   Diagnosis Date   ??? Asthma     childhood   ??? ESRF (end stage renal failure) (HCC)     DIALYSIS MWF- [facility]   ??? Hypertension      Past Surgical History:   Procedure Laterality Date   ??? HX UROLOGICAL      KIDNEY BX   ??? VASCULAR SURGERY PROCEDURE UNLIST      AVF      Family History   Problem Relation Age of Onset   ??? Arthritis Mother    ??? Heart Attack Father      Social History     Tobacco Use   ??? Smoking status: Former Smoker   ??? Smokeless tobacco: Never Used   Substance Use Topics   ??? Alcohol use: Not Currently      Prior to Admission medications    Medication Sig Start Date End Date Taking? Authorizing Provider   minoxidiL (LONITEN) 2.5 mg tablet Take 2.5 mg by mouth two (2) times a day.   Yes Provider, Historical   cloNIDine HCL (CATAPRES) 0.3 mg tablet Take 0.3 mg by mouth two (2) times a day.   Yes Provider, Historical   carvediloL (COREG) 25 mg tablet Take 25 mg by mouth two (2) times a day.   Yes Provider, Historical   sucroferric oxyhydroxide (Velphoro) 500 mg chew chewable tablet Take  by mouth three (3) times daily (with meals).   Yes Provider, Historical   hydrALAZINE (APRESOLINE) 100 mg tablet Take  by mouth two (2) times a day.  PT NOT SURE   OF DOSE; DOSE NOTED OBTAINED FROM PTS PHARMACY   Yes Provider, Historical        Allergies   Allergen Reactions   ??? Renvela [Sevelamer Carbonate] Other (comments)     Swelling of lips and face (powdered version)       Review of Systems:   Constitutional: denies fatigue, fever, chills, weight loss  Eyes: denies redness, change in vision  Ears, Nose, Mouth, Throat, and Face: denies sore throat, congestion, hearing loss  Respiratory: denies shortness of breath, cough or dyspnea  Cardiovascular: denies chest pain, dizziness   Gastrointestinal: denies abdominal pain, nausea, diarrhea or vomiting  Musculoskeletal:denies muscles aches, cramps  Neurological: denies numbness, tingling in extremities    Objective:     Vitals:    06/24/18 0919 06/30/18 0617 06/30/18 0651   BP:  147/87    Pulse:  (!) 102    Resp:  20    Temp:  98.2 ??F (36.8 ??C)    SpO2:  100%    Weight: 95.3 kg (210 lb) 94.2 kg (207 lb 10.8 oz) 94.2 kg (207 lb  10.8 oz)   Height: 6\' 2"  (1.88 m)  6\' 2"  (1.88 m)        Physical Exam:  GENERAL: alert, cooperative, no distress, appears stated age  CV: RRR, no murmur, rub or gallop  RESP: CTAB, no wheeze  GI: Active bowel sounds, no TTP  PULSES: BL radial palpable, BL DP palpable  EXT: no swelling, numbness or tingling in BUE    PVL 06/30/2018 Rt Vein Mapping:  Cephalic: dist Forearm 0.27cm, mid upper arm 0.27, axilla 0.31.  Basilic: dist 0.39, mid 0.4, upper bicep 0.58  Brachial A triphasic    Assessment:     ESRD dependent on Dialysis with funtioning Rt IJ TDC    Plan:     Vein mapping demonstrates adequate Rt cephalic vein and brachial artery for AVF. Discussed AVF formation with patient versus thrombectomy of Lt AVG including procedure, risks, benefits, outcomes and follow up. Patient agreed to proceed with AVF formation and address possible removal of aneurysm of AVG in future on elective basis.     Signed By: Dana AllanJessica Jiovanni Heeter, PA     June 30, 2018

## 2018-07-21 LAB — POC CHEM8
BUN: 39 mg/dl — ABNORMAL HIGH (ref 7–25)
BUN: 39 mg/dl — ABNORMAL HIGH (ref 7–25)
CALCIUM,IONIZED: 4.2 mg/dL — ABNORMAL LOW (ref 4.40–5.40)
CALCIUM,IONIZED: 4.2 mg/dL — ABNORMAL LOW (ref 4.40–5.40)
CO2 Total: 25 mmol/L (ref 21–32)
CO2, TOTAL: 25 mmol/L (ref 21–32)
Chloride: 101 mEq/L (ref 98–107)
Chloride: 101 mEq/L (ref 98–107)
Creatinine: 15.3 mg/dl — ABNORMAL HIGH (ref 0.6–1.3)
Creatinine: 15.3 mg/dl — ABNORMAL HIGH (ref 0.6–1.3)
Glucose: 92 mg/dL (ref 74–106)
Glucose: 92 mg/dL (ref 74–106)
HCT: 39 % — ABNORMAL LOW (ref 40–54)
HGB: 13.3 gm/dl (ref 13.0–17.2)
Hematocrit: 39 % — ABNORMAL LOW (ref 40–54)
Hemoglobin: 13.3 gm/dl (ref 13.0–17.2)
Potassium: 4.8 mEq/L (ref 3.5–4.9)
Potassium: 4.8 mEq/L (ref 3.5–4.9)
Sodium: 138 mEq/L (ref 136–145)
Sodium: 138 mEq/L (ref 136–145)

## 2018-09-14 ENCOUNTER — Encounter: Payer: MEDICARE | Attending: Urology | Primary: Internal Medicine

## 2018-09-17 ENCOUNTER — Ambulatory Visit: Attending: Urology | Primary: Internal Medicine

## 2018-09-17 ENCOUNTER — Encounter: Payer: MEDICARE | Attending: Urology | Primary: Internal Medicine

## 2018-09-17 ENCOUNTER — Ambulatory Visit: Admit: 2018-09-17 | Discharge: 2018-09-17 | Payer: MEDICARE | Attending: Urology | Primary: Internal Medicine

## 2018-09-17 DIAGNOSIS — Z960 Presence of urogenital implants: Secondary | ICD-10-CM

## 2018-09-17 LAB — AMB POC URINALYSIS DIP STICK AUTO W/O MICRO
Bilirubin (UA POC): NEGATIVE
Bilirubin, Urine, POC: NEGATIVE
Blood (UA POC): NEGATIVE
Blood (UA POC): NEGATIVE
Glucose (UA POC): NEGATIVE
Glucose, Urine, POC: NEGATIVE
Ketones (UA POC): NEGATIVE
Ketones, Urine, POC: NEGATIVE
Nitrite, Urine, POC: NEGATIVE
Nitrites (UA POC): NEGATIVE
Specific Gravity, Urine, POC: 1.02 NA (ref 1.001–1.035)
Specific gravity (UA POC): 1.02 (ref 1.001–1.035)
Urobilinogen (UA POC): 0.2 (ref 0.2–1)
Urobilinogen, POC: 0.2 (ref 0.2–1)
pH (UA POC): 6.5 (ref 4.6–8.0)
pH, Urine, POC: 6.5 NA (ref 4.6–8.0)

## 2018-09-17 MED ORDER — TRIMETHOPRIM-SULFAMETHOXAZOLE 160 MG-800 MG TAB
160-800 mg | ORAL_TABLET | ORAL | 0 refills | Status: AC
Start: 2018-09-17 — End: 2018-09-17

## 2018-09-17 NOTE — Progress Notes (Signed)
Progress Notes by Vance Gather, MD at 09/17/18 1515                Author: Vance Gather, MD  Service: --  Author Type: Physician       Filed: 09/17/18 1600  Encounter Date: 09/17/2018  Status: Signed          Editor: Vance Gather, MD (Physician)                           ASSESSMENT:              ICD-10-CM  ICD-9-CM             1.  Retained ureteral stent  Z96.0  V43.89  CYSTOSCOPY,REMV CALCULUS,SIMPLE     2.  ESRD (end stage renal disease) (HCC)  N18.6  585.6  AMB POC URINALYSIS DIP STICK AUTO W/O MICRO                CYSTOSCOPY,REMV CALCULUS,SIMPLE           3.  Kidney transplant recipient  Z94.0  V42.0  CYSTOSCOPY,REMV CALCULUS,SIMPLE                     PLAN:     Cystoscopy and stent removal are performed today without incident.  See attached cysto note for details.  The patient will follow up at the Kidney Transplant center at [facility] as planned.   Follow up with me PRN.                   Chief Complaint       Patient presents with        ?  End Stage Renal Disease     ?  Other             KTP Recipient           HISTORY OF PRESENT ILLNESS:  Mark Castillo  is a 28 y.o. male who presents  today for consultation and has been referred by [facility] Kidney Transplant Dept for stent management.      The patient has ESRD 2/2 HTN. Was on HD for 5 years prior to transplant.  A living donor kidney transplant was performed approximately 4 weeks ago.  Graft function is good.  Stent removal is required at this time.   The patient denies voiding complaints.  No fever or chills.  Patient reports no more than mild hematuria, no clots.     There are no other associated symptoms or modifying factors.        Review of Systems   Constitutional: Fever: No   Skin: Rash: No   HEENT: Hearing difficulty: No   Eyes: Blurred vision: No   Cardiovascular: Chest pain: No   Respiratory: Shortness of breath: No   Gastrointestinal: Nausea/vomiting: No   Musculoskeletal: Back pain: No   Neurological: Weakness: No   Psychological:  Memory loss: No   Comments/additional findings:            Past Medical History:        Diagnosis  Date         ?  Asthma            childhood         ?  ESRF (end stage renal failure) ([place])            DIALYSIS MWF- [facility]         ?  Hypertension               Past Surgical History:         Procedure  Laterality  Date          ?  HX RENAL TRANSPLANT    08/11/2018          Living unrelated kidney transplant into right iliac fossa with preparation of living donor kidney          ?  HX UROLOGICAL              KIDNEY BX          ?  VASCULAR SURGERY PROCEDURE UNLIST              AVF             Social History          Tobacco Use         ?  Smoking status:  Former Smoker     ?  Smokeless tobacco:  Never Used       Substance Use Topics         ?  Alcohol use:  Not Currently     ?  Drug use:  Yes              Types:  Marijuana             Comment: LAST  USED  A WEEK AGO             Allergies        Allergen  Reactions         ?  Sevelamer Carbonate  Other (comments) and Swelling             Swelling of lips and face (powdered version)   Powdered form only , made lips swell   Can take pill ??With no problems                Family History         Problem  Relation  Age of Onset          ?  Arthritis  Mother            ?  Heart Attack  Father               Current Outpatient Medications          Medication  Sig  Dispense  Refill           ?  ferrous sulfate (IRON) 325 mg (65 mg iron) EC tablet  TAKE 1 TABLET BY MOUTH EVERY DAY         ?  famotidine (PEPCID) 20 mg tablet  Take 20 mg by mouth daily.         ?  mycophenolic acid (MYFORTIC) 180 mg delayed release tablet  Take 360 mg by mouth four (4) times daily.         ?  NIFEdipine ER (PROCARDIA XL) 90 mg ER tablet  Take 90 mg by mouth two (2) times a day.         ?  predniSONE (DELTASONE) 20 mg tablet           ?  simethicone (MYLICON) 80 mg chewable tablet  Take 80 mg by mouth every six (6) hours as needed.         ?  sodium bicarbonate 650 mg tablet  Take 650 mg by  mouth three (3)  times daily.         ?  trimethoprim-sulfamethoxazole (BACTRIM DS, SEPTRA DS) 160-800 mg per tablet  Take 1 Tab by mouth.         ?  tacrolimus 4 mg Tb24  Take 12 mg by mouth.         ?  valGANciclovir (VALCYTE) 450 mg tablet  Take 900 mg by mouth daily.         ?  trimethoprim-sulfamethoxazole (Bactrim DS) 160-800 mg per tablet  Take 1 Tab by mouth now for 1 dose.  1 Tab  0     ?  minoxidiL (LONITEN) 2.5 mg tablet  Take 2.5 mg by mouth two (2) times a day.         ?  cloNIDine HCL (CATAPRES) 0.3 mg tablet  Take 0.3 mg by mouth two (2) times a day.         ?  carvediloL (COREG) 25 mg tablet  Take 25 mg by mouth two (2) times a day.         ?  sucroferric oxyhydroxide (Velphoro) 500 mg chew chewable tablet  Take  by mouth three (3) times daily (with meals).               ?  hydrALAZINE (APRESOLINE) 100 mg tablet  Take  by mouth two (2) times a day. PT NOT SURE   OF DOSE; DOSE NOTED OBTAINED FROM PTS PHARMACY               PHYSICAL EXAMINATION:    Visit Vitals      Ht  6\' 2"  (1.88 m)     Wt  207 lb (93.9 kg)        BMI  26.58 kg/m??        Constitutional: WDWN, Pleasant and appropriate affect, No acute distress .     Neck: FROM   CV:  No peripheral swelling noted   Respiratory: No respiratory distress or difficulties   Abdomen:  No abdominal masses or tenderness. No CVA tenderness. No inguinal  hernias noted. Well healed scar.   GU: normal testicular exam    Skin: No jaundice.     Neuro/Psych:  Alert and o riented x 3, affect appropriate.    Lymphatic:   No enlarged inguinal lymph nodes.        REVIEW OF LABS AND IMAGING:          Results for orders placed or performed in visit on 09/17/18     AMB POC URINALYSIS DIP STICK AUTO W/O MICRO         Result  Value  Ref Range            Color (UA POC)  Yellow         Clarity (UA POC)  Clear         Glucose (UA POC)  Negative  Negative       Bilirubin (UA POC)  Negative  Negative       Ketones (UA POC)  Negative  Negative       Specific gravity (UA POC)  1.020   1.001 - 1.035       Blood (UA POC)  Negative  Negative       pH (UA POC)  6.5  4.6 - 8.0       Protein (UA POC)  Trace  Negative       Urobilinogen (UA POC)  0.2 mg/dL  0.2 - 1  Nitrites (UA POC)  Negative  Negative            Leukocyte esterase (UA POC)  Trace  Negative        Abd US 04/21/2018   IMPRESSION  Left pleural effusion.  Mild hepatomegaly, similar to prior.  Small echogenic kidneys consistent with history of chronic medical renal disease. Multiple bilateral renal cysts, the largest on the left with evidence for mild complexity  as described.  Usual tapering not demonstrated in the abdominal aorta distally, without aneurysmal dilatation.         Imaging Report Reviewed? YES, RUS   Images Reviewed?      NO      Type:   None      Other Lab Data Reviewed?   YES - UA          A copy of today's office visit with all pertinent imaging results and labs were [facility].         Ruben ReasonJohn B. Raheim Beutler, MD   Assistant Professor   [facility] [employer] Dept of Urology   [facility] [place], [facility]      09/17/2018          Patient's BMI is out of the normal parameters.  Information about BMI was given and patient was advised to follow-up with their PCP for further management.      Medical documentation entered into the chart by Lin Givenslaire McHugh, medical scribe for Ruben ReasonJohn B Breeze Berringer, MD on  09/17/2018.

## 2018-09-17 NOTE — Progress Notes (Signed)
CYSTOSCOPY and STENT REMOVAL PROCEDURE        Patient Name: Mark Castillo            Date of Procedure: 09/17/2018     Pre-procedure Diagnosis:    Encounter Diagnoses     ICD-10-CM ICD-9-CM   1. Retained ureteral stent Z96.0 V43.89   2. ESRD (end stage renal disease) ([place]) N18.6 585.6   3. Kidney transplant recipient Z94.0 V42.0       Post-procedure Diagnosis: Same    Consent:  All risks, benefits and options were reviewed in detail and the patient agrees to procedure. Risks include but are not limited to bleeding, infection, sepsis, death, dysuria and others.     Universal Procedure Pause:    1. Correct patient confirmed:  yes  2. Allergies confirmed:  yes  3. Patient taking antiplatelet medications:  no  4. Patient taking anticoagulants:  no  5. Correct procedure and side confirmed and consent signed:  yes  6. Correct antibiotics confirmed:  yes    Antibiotic Provided? Bactrim    Procedure:  The patient was placed in the lithotomy position, and prepped and draped in the normal fashion. 5 ml of 4% Lidocaine gel was placed in the urethra. Once adequate anesthesia was achieved; the flexible cystoscope was placed into the bladder.  The stent was then removed with a flexible grasper and the patient tolerated the procedure well.       Findings as follows:      Meatus: normal  Urethra: normal  Bladder neck: normal  Trigone:  normal  Prostate: non-obstructing   Trabeculation: none  Diverticulum: none  Lesions: none   Stent grasped and removed intact    Lab / Imaging:   Results for orders placed or performed in visit on 09/17/18   AMB POC URINALYSIS DIP STICK AUTO W/O MICRO   Result Value Ref Range    Color (UA POC) Yellow     Clarity (UA POC) Clear     Glucose (UA POC) Negative Negative    Bilirubin (UA POC) Negative Negative    Ketones (UA POC) Negative Negative    Specific gravity (UA POC) 1.020 1.001 - 1.035    Blood (UA POC) Negative Negative    pH (UA POC) 6.5 4.6 - 8.0    Protein (UA POC) Trace Negative     Urobilinogen (UA POC) 0.2 mg/dL 0.2 - 1    Nitrites (UA POC) Negative Negative    Leukocyte esterase (UA POC) Trace Negative          Assessment:  Pt is a 28 y.o. male with ESRD 2/2 HTN. S/p Living Donor Kidney Transplant 08/11/18.    Plan:  Stent removed today  RTC PRN      Vance Gather, MD  [facility] documentation entered into the chart by Nicky Pugh, medical scribe for Vance Gather, MD on 09/17/2018.

## 2018-09-17 NOTE — Progress Notes (Signed)
CYSTOSCOPY and STENT REMOVAL PROCEDURE        Patient Name: Eliu Reddington            Date of Procedure: 09/17/2018     Pre-procedure Diagnosis:    Encounter Diagnoses     ICD-10-CM ICD-9-CM   1. Retained ureteral stent Z96.0 V43.89   2. ESRD (end stage renal disease) (HCC) N18.6 585.6   3. Kidney transplant recipient Z94.0 V42.0       Post-procedure Diagnosis: Same    Consent:  All risks, benefits and options were reviewed in detail and the patient agrees to procedure. Risks include but are not limited to bleeding, infection, sepsis, death, dysuria and others.     Universal Procedure Pause:    1. Correct patient confirmed:  yes  2. Allergies confirmed:  yes  3. Patient taking antiplatelet medications:  no  4. Patient taking anticoagulants:  no  5. Correct procedure and side confirmed and consent signed:  yes  6. Correct antibiotics confirmed:  yes    Antibiotic Provided? Bactrim    Procedure:  The patient was placed in the lithotomy position, and prepped and draped in the normal fashion. 5 ml of 4% Lidocaine gel was placed in the urethra. Once adequate anesthesia was achieved; the flexible cystoscope was placed into the bladder.  The stent was then removed with a flexible grasper and the patient tolerated the procedure well.       Findings as follows:      Meatus: normal  Urethra: normal  Bladder neck: normal  Trigone:  normal  Prostate: non-obstructing   Trabeculation: none  Diverticulum: none  Lesions: none   Stent grasped and removed intact    Lab / Imaging:   Results for orders placed or performed in visit on 09/17/18   AMB POC URINALYSIS DIP STICK AUTO W/O MICRO   Result Value Ref Range    Color (UA POC) Yellow     Clarity (UA POC) Clear     Glucose (UA POC) Negative Negative    Bilirubin (UA POC) Negative Negative    Ketones (UA POC) Negative Negative    Specific gravity (UA POC) 1.020 1.001 - 1.035    Blood (UA POC) Negative Negative    pH (UA POC) 6.5 4.6 - 8.0    Protein (UA POC) Trace Negative     Urobilinogen (UA POC) 0.2 mg/dL 0.2 - 1    Nitrites (UA POC) Negative Negative    Leukocyte esterase (UA POC) Trace Negative          Assessment:  Pt is a 28 y.o. male with ESRD 2/2 HTN. S/p Living Donor Kidney Transplant 08/11/18.    Plan:  Stent removed today  RTC PRN      Tyreece Gelles B. Adelayde Minney, MD  [facility] [place]    Medical documentation entered into the chart by Claire McHugh, medical scribe for [place] Virrueta B Nyellie Yetter, MD on 09/17/2018.

## 2018-09-17 NOTE — Progress Notes (Signed)
ASSESSMENT:     ICD-10-CM ICD-9-CM    1. Retained ureteral stent Z96.0 V43.89 CYSTOSCOPY,REMV CALCULUS,SIMPLE   2. ESRD (end stage renal disease) (HCC) N18.6 585.6 AMB POC URINALYSIS DIP STICK AUTO W/O MICRO      CYSTOSCOPY,REMV CALCULUS,SIMPLE   3. Kidney transplant recipient Z94.0 V42.0 CYSTOSCOPY,REMV CALCULUS,SIMPLE              PLAN:    Cystoscopy and stent removal are performed today without incident.  See attached cysto note for details.  The patient will follow up at [facility] Kid[facility] Trans[facility] as planned.  Follow up with me PRN.           Chief Complaint   Patient presents with   ??? End Stage Renal Disease   ??? Other     KTP Recipient       HISTORY OF PRESENT ILLNESS:  Mark Castillo is a 28 y.o. male who presents today for consultation and [facility] for stent management.    The patient has ESRD 2/2 HTN. Was on HD for 5 years prior to transplant.  A living donor kidney transplant was performed approximately 4 weeks ago.  Graft function is good.  Stent removal is required at this time.  The patient denies voiding complaints.  No fever or chills.  Patient reports no more than mild hematuria, no clots.    There are no other associated symptoms or modifying factors.      Review of Systems  Constitutional: Fever: No  Skin: Rash: No  HEENT: Hearing difficulty: No  Eyes: Blurred vision: No  Cardiovascular: Chest pain: No  Respiratory: Shortness of breath: No  Gastrointestinal: Nausea/vomiting: No  Musculoskeletal: Back pain: No  Neurological: Weakness: No  Psychological: Memory loss: No  Comments/additional findings:       Past Medical History:   Diagnosis Date   ??? Asthma     childhood   ??? ESRF (end stage renal failure) (HCC)     DIALYSIS MWF- [facility]   ??? Hypertension        Past Surgical History:   Procedure Laterality Date   ??? HX RENAL TRANSPLANT  08/11/2018    Living unrelated kidney transplant into right iliac fossa with preparation of living donor  kidney   ??? HX UROLOGICAL      KIDNEY BX   ??? VASCULAR SURGERY PROCEDURE UNLIST      AVF       Social History     Tobacco Use   ??? Smoking status: Former Smoker   ??? Smokeless tobacco: Never Used   Substance Use Topics   ??? Alcohol use: Not Currently   ??? Drug use: Yes     Types: Marijuana     Comment: LAST  USED  A WEEK AGO       Allergies   Allergen Reactions   ??? Sevelamer Carbonate Other (comments) and Swelling     Swelling of lips and face (powdered version)  Powdered form only , made lips swell  Can take pill ??With no problems         Family History   Problem Relation Age of Onset   ??? Arthritis Mother    ??? Heart Attack Father        Current Outpatient Medications   Medication Sig Dispense Refill   ??? ferrous sulfate (IRON) 325 mg (65 mg iron) EC tablet TAKE 1 TABLET BY MOUTH EVERY DAY     ???  famotidine (PEPCID) 20 mg tablet Take 20 mg by mouth daily.     ??? mycophenolic acid (MYFORTIC) 623 mg delayed release tablet Take 360 mg by mouth four (4) times daily.     ??? NIFEdipine ER (PROCARDIA XL) 90 mg ER tablet Take 90 mg by mouth two (2) times a day.     ??? predniSONE (DELTASONE) 20 mg tablet      ??? simethicone (MYLICON) 80 mg chewable tablet Take 80 mg by mouth every six (6) hours as needed.     ??? sodium bicarbonate 650 mg tablet Take 650 mg by mouth three (3) times daily.     ??? trimethoprim-sulfamethoxazole (BACTRIM DS, SEPTRA DS) 160-800 mg per tablet Take 1 Tab by mouth.     ??? tacrolimus 4 mg Tb24 Take 12 mg by mouth.     ??? valGANciclovir (VALCYTE) 450 mg tablet Take 900 mg by mouth daily.     ??? trimethoprim-sulfamethoxazole (Bactrim DS) 160-800 mg per tablet Take 1 Tab by mouth now for 1 dose. 1 Tab 0   ??? minoxidiL (LONITEN) 2.5 mg tablet Take 2.5 mg by mouth two (2) times a day.     ??? cloNIDine HCL (CATAPRES) 0.3 mg tablet Take 0.3 mg by mouth two (2) times a day.     ??? carvediloL (COREG) 25 mg tablet Take 25 mg by mouth two (2) times a day.     ??? sucroferric oxyhydroxide (Velphoro) 500 mg chew chewable tablet Take   by mouth three (3) times daily (with meals).     ??? hydrALAZINE (APRESOLINE) 100 mg tablet Take  by mouth two (2) times a day. PT NOT SURE   OF DOSE; DOSE NOTED OBTAINED FROM PTS PHARMACY         PHYSICAL EXAMINATION:   Visit Vitals  Ht 6\' 2"  (1.88 m)   Wt 207 lb (93.9 kg)   BMI 26.58 kg/m??     Constitutional: WDWN, Pleasant and appropriate affect, No acute distress.    Neck: FROM  CV:  No peripheral swelling noted  Respiratory: No respiratory distress or difficulties  Abdomen:  No abdominal masses or tenderness. No CVA tenderness. No inguinal hernias noted. Well healed scar.  GU: normal testicular exam   Skin: No jaundice.    Neuro/Psych:  Alert and oriented x 3, affect appropriate.   Lymphatic:   No enlarged inguinal lymph nodes.      REVIEW OF LABS AND IMAGING:      Results for orders placed or performed in visit on 09/17/18   AMB POC URINALYSIS DIP STICK AUTO W/O MICRO   Result Value Ref Range    Color (UA POC) Yellow     Clarity (UA POC) Clear     Glucose (UA POC) Negative Negative    Bilirubin (UA POC) Negative Negative    Ketones (UA POC) Negative Negative    Specific gravity (UA POC) 1.020 1.001 - 1.035    Blood (UA POC) Negative Negative    pH (UA POC) 6.5 4.6 - 8.0    Protein (UA POC) Trace Negative    Urobilinogen (UA POC) 0.2 mg/dL 0.2 - 1    Nitrites (UA POC) Negative Negative    Leukocyte esterase (UA POC) Trace Negative     Abd [place] 04/21/2018  IMPRESSION  Left pleural effusion.  Mild hepatomegaly, similar to prior.  Small echogenic kidneys consistent with history of chronic medical renal disease. Multiple bilateral renal cysts, the largest on the left with evidence for mild  complexity as described.  Usual tapering not demonstrated in the abdominal aorta distally, without aneurysmal dilatation.      Imaging Report Reviewed? YES, RUS  Images Reviewed?      NO      Type:   None    Other Lab Data Reviewed?   YES - UA       A copy of today's office visit with all pertinent imaging results and labs were sent  to [facility].      Ruben ReasonJohn B. Skylar Priest, MD  Assistant Professor  [facility] Dept of Urology  [facility] [place]    09/17/2018       Patient's BMI is out of the normal parameters.  Information about BMI was given and patient was advised to follow-up with their PCP for further management.    Medical documentation entered into the chart by Lin Givenslaire McHugh, medical scribe for Ruben ReasonJohn B Kabrea Seeney, MD on 09/17/2018.

## 2019-05-20 ENCOUNTER — Ambulatory Visit: Attending: Urology | Primary: Internal Medicine

## 2019-05-20 ENCOUNTER — Ambulatory Visit: Admit: 2019-05-20 | Discharge: 2019-05-20 | Attending: Urology | Primary: Internal Medicine

## 2019-05-20 DIAGNOSIS — R3129 Other microscopic hematuria: Secondary | ICD-10-CM

## 2019-05-20 LAB — AMB POC URINALYSIS DIP STICK AUTO W/O MICRO
Bilirubin (UA POC): NEGATIVE
Bilirubin, Urine, POC: NEGATIVE
Glucose (UA POC): NEGATIVE
Glucose, Urine, POC: NEGATIVE
Ketones (UA POC): NEGATIVE
Ketones, Urine, POC: NEGATIVE
Leukocyte Esterase, Urine, POC: NEGATIVE
Leukocyte esterase (UA POC): NEGATIVE
Nitrite, Urine, POC: NEGATIVE
Nitrites (UA POC): NEGATIVE
Specific Gravity, Urine, POC: 1.025 NA (ref 1.001–1.035)
Specific gravity (UA POC): 1.025 (ref 1.001–1.035)
Urobilinogen (UA POC): 0.2 (ref 0.2–1)
Urobilinogen, POC: 0.2 (ref 0.2–1)
pH (UA POC): 6 (ref 4.6–8.0)
pH, Urine, POC: 6 NA (ref 4.6–8.0)

## 2019-05-20 NOTE — Progress Notes (Signed)
ASSESSMENT:     ICD-10-CM ICD-9-CM    1. Microhematuria  R31.29 599.72 AMB POC URINALYSIS DIP STICK AUTO W/O MICRO      [place] ABD COMP   2. Living-donor kidney transplant recipient  Z94.0 V42.0 [place] ABD COMP         -Microhematuria, chronic since transplant. +RBCs on UAs from 07/2018 - 04/2019. NCCT A/P 04/22/19: atrophic native kidneys, no suspicious upper tract or transplant kidney pathology,   No risk for significant pathology.  No need for another cysto (had cysto last year).  Will just monitor.  He'll let me know if he ever sees gross hematuria.    -S/p LDKTx 08/11/18. 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      PLAN:    Abdominal US of transplant and native kidneys in 3 mos.   Follow up in 3 months to review.            Chief Complaint   Patient presents with   ??? Hematuria       HISTORY OF PRESENT ILLNESS:  Mark Castillo is a 29 y.o. male who is referred back by [facility] transplant clinic for evaluation of microhematuria. He was last seen 07/2018 for cysto stent removal.   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 Was on HD for 5 years prior to transplant.  S/p LDKTx 08/19/18.     He denies history of gross hematuria. No dysuria. Asymptomatic for UTI. No f/c/n/v. No voiding complaints today. Denies flank or abdominal pain.        Review of Systems  Constitutional: Fever: No  Skin: Rash: No  HEENT: Hearing difficulty: No  Eyes: Blurred vision: No  Cardiovascular: Chest pain: No  Respiratory: Shortness of breath: No  Gastrointestinal: Nausea/vomiting: No  Musculoskeletal: Back pain: No  Neurological: Weakness: No  Psychological: Memory loss: No  Comments/additional findings:       Past Medical History:   Diagnosis Date   ??? Asthma     childhood   ??? Chronic diastolic heart failure (HCC)    ??? ESRF (end stage renal failure) (HCC)     DIALYSIS MWF- [facility]   ??? Hematuria    ??? Hyperparathyroidism (HCC)    ??? Hypertension    ??? Hypertensive kidney disease with end-stage renal disease on dialysis [facility])    ??? Hypoxia    ??? Kidney transplant recipient    ??? Seizure  [facility])        Past Surgical History:   Procedure Laterality Date   ??? HX RENAL TRANSPLANT  08/11/2018    Living unrelated kidney transplant into right iliac fossa with preparation of living donor kidney   ??? HX UROLOGICAL      KIDNEY BX   ??? VASCULAR SURGERY PROCEDURE UNLIST      AVF       Social History     Tobacco Use   ??? Smoking status: Former Smoker     Packs/day: 0.10     Years: 5.00     Pack years: 0.50     Types: Cigarettes     Quit date: 10/19/2012     Years since quitting: 6.5   ??? Smokeless tobacco: Never Used   Substance Use Topics   ??? Alcohol use: Not Currently   ??? Drug use: Yes     Types: Marijuana       Allergies   Allergen Reactions   ??? Sevelamer Carbonate Other (comments) and Swelling     Swelling of lips and face (powdered version)  Powdered form  only , made lips swell  Can take pill ??With no problems     ??? Bromelains Unknown (comments)       Family History   Problem Relation Age of Onset   ??? Arthritis Mother    ??? Hypertension Mother    ??? Heart Attack Father    ??? Hypertension Paternal Grandfather        Current Outpatient Medications   Medication Sig Dispense Refill   ??? cholecalciferol (VITAMIN D3) (1000 Units /25 mcg) tablet Take 1,000 Units by mouth daily.     ??? famotidine (PEPCID) 20 mg tablet Take 20 mg by mouth daily.     ??? mycophenolic acid (MYFORTIC) 161 mg delayed release tablet Take 360 mg by mouth four (4) times daily.     ??? predniSONE (DELTASONE) 20 mg tablet      ??? sodium bicarbonate 650 mg tablet Take 650 mg by mouth three (3) times daily.     ??? trimethoprim-sulfamethoxazole (BACTRIM DS, SEPTRA DS) 160-800 mg per tablet Take 1 Tab by mouth.     ??? tacrolimus 4 mg Tb24 Take 12 mg by mouth.     ??? minoxidiL (LONITEN) 2.5 mg tablet Take 2.5 mg by mouth two (2) times a day.     ??? cloNIDine HCL (CATAPRES) 0.3 mg tablet Take 0.3 mg by mouth two (2) times a day.     ??? carvediloL (COREG) 25 mg tablet Take 25 mg by mouth two (2) times a day.         PHYSICAL EXAMINATION:   Visit Vitals  Ht 6\' 2"  (1.88  m)   Wt 210 lb (95.3 kg)   BMI 26.96 kg/m??       GEN: NAD, alert and oriented  PULM: nl resp effort, no distress  PSYCH: Nl mood and affect     REVIEW OF LABS AND IMAGING:      Results for orders placed or performed in visit on 05/20/19   AMB POC URINALYSIS DIP STICK AUTO W/O MICRO   Result Value Ref Range    Color (UA POC) Yellow     Clarity (UA POC) Clear     Glucose (UA POC) Negative Negative    Bilirubin (UA POC) Negative Negative    Ketones (UA POC) Negative Negative    Specific gravity (UA POC) 1.025 1.001 - 1.035    Blood (UA POC) 2+ Negative    pH (UA POC) 6.0 4.6 - 8.0    Protein (UA POC) 1+ Negative    Urobilinogen (UA POC) 0.2 mg/dL 0.2 - 1    Nitrites (UA POC) Negative Negative    Leukocyte esterase (UA POC) Negative Negative     CT Abd Pelv wo Cont 04/22/19  IMPRESSION   1. No findings to account for hematuria symptoms. The right lower quadrant transplant kidney appears normal.   2. Pericardial effusion may be slightly decreased.     Abd [place] 04/21/2018  IMPRESSION  Left pleural effusion.  Mild hepatomegaly, similar to prior.  Small echogenic kidneys consistent with history of chronic medical renal disease. Multiple bilateral renal cysts, the largest on the left with evidence for mild complexity as described.  Usual tapering not demonstrated in the abdominal aorta distally, without aneurysmal dilatation.      Imaging Report Reviewed? YES, CT a/p   Images Reviewed?      YES      Type:  CT a/p    Other Lab Data Reviewed?   YES - UAs from 07/2018 -  04/2019, CBC and BMP 05/12/19.   Other Date Reviewed? Transplant clinic and Nephrology referring notes.         Ruben Reason, MD  [facility] [place] [place]    CC: Collene Mares, MD  CC: [facility]    Medical documentation is provided with the assistance of Sabrina A. Delfenthal, medical scribe for Ruben Reason, MD on 05/20/2019

## 2019-08-18 ENCOUNTER — Encounter: Attending: Urology | Primary: Internal Medicine

## 2019-08-18 NOTE — Progress Notes (Deleted)
ASSESSMENT:   {No Diagnosis Found}      -Microhematuria, chronic since transplant. +RBCs on UAs from 07/2018 - 04/2019. NCCT A/P 04/22/19: atrophic native kidneys, no suspicious upper tract or transplant kidney pathology,   No risk for significant pathology. No need for another cysto (had cysto last year). Will just monitor.  He'll let me know if he ever sees gross hematuria.    -S/p LDKTx 08/11/18. 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      PLAN:    Abdominal US of transplant and native kidneys in 3 mos.   Follow up in 3 months to review.            No chief complaint on file.      HISTORY OF PRESENT ILLNESS:  Mark Castillo is a 29 y.o. male who is referred back by [facility] transplant clinic for evaluation of microhematuria. 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 Was on HD for 5 years prior to transplant.  S/p LDKTx 08/19/18. He was last seen 07/2018 for cysto stent removal.     He denies history of gross hematuria. No dysuria. Asymptomatic for UTI. No f/c/n/v. No voiding complaints today. Denies flank or abdominal pain.      Review of Systems  Constitutional: Fever:   Skin: Rash:   HEENT: Hearing difficulty:   Eyes: Blurred vision:   Cardiovascular: Chest pain:   Respiratory: Shortness of breath:   Gastrointestinal: Nausea/vomiting:   Musculoskeletal: Back pain:   Neurological: Weakness:   Psychological: Memory loss:   Comments/additional findings:       Past Medical History:   Diagnosis Date   ??? Asthma     childhood   ??? Chronic diastolic heart failure ([place])    ??? ESRF (end stage renal failure) ([place])     DIALYSIS MWF- [facility]   ??? Hematuria    ??? Hyperparathyroidism ([place])    ??? Hypertension    ??? Hypertensive kidney disease with end-stage renal disease on dialysis [facility])    ??? Hypoxia    ??? Kidney transplant recipient    ??? Seizure [facility])        Past Surgical History:   Procedure Laterality Date   ??? HX RENAL TRANSPLANT  08/11/2018    Living unrelated kidney transplant into right iliac fossa with preparation of living donor kidney   ??? HX UROLOGICAL       KIDNEY BX   ??? VASCULAR SURGERY PROCEDURE UNLIST      AVF       Social History     Tobacco Use   ??? Smoking status: Former Smoker     Packs/day: 0.10     Years: 5.00     Pack years: 0.50     Types: Cigarettes     Quit date: 10/19/2012     Years since quitting: 6.8   ??? Smokeless tobacco: Never Used   Substance Use Topics   ??? Alcohol use: Not Currently   ??? Drug use: Yes     Types: Marijuana       Allergies   Allergen Reactions   ??? Sevelamer Carbonate Other (comments) and Swelling     Swelling of lips and face (powdered version)  Powdered form only , made lips swell  Can take pill ??With no problems     ??? Bromelains Unknown (comments)       Family History   Problem Relation Age of Onset   ??? Arthritis Mother    ??? Hypertension Mother    ??? Heart Attack Father    ???  Hypertension Paternal Grandfather        Current Outpatient Medications   Medication Sig Dispense Refill   ??? cholecalciferol (VITAMIN D3) (1000 Units /25 mcg) tablet Take 1,000 Units by mouth daily.     ??? famotidine (PEPCID) 20 mg tablet Take 20 mg by mouth daily.     ??? mycophenolic acid (MYFORTIC) 180 mg delayed release tablet Take 360 mg by mouth four (4) times daily.     ??? predniSONE (DELTASONE) 20 mg tablet      ??? sodium bicarbonate 650 mg tablet Take 650 mg by mouth three (3) times daily.     ??? trimethoprim-sulfamethoxazole (BACTRIM DS, SEPTRA DS) 160-800 mg per tablet Take 1 Tab by mouth.     ??? tacrolimus 4 mg Tb24 Take 12 mg by mouth.     ??? minoxidiL (LONITEN) 2.5 mg tablet Take 2.5 mg by mouth two (2) times a day.     ??? cloNIDine HCL (CATAPRES) 0.3 mg tablet Take 0.3 mg by mouth two (2) times a day.     ??? carvediloL (COREG) 25 mg tablet Take 25 mg by mouth two (2) times a day.         PHYSICAL EXAMINATION:   There were no vitals taken for this visit.    GEN: NAD, alert and oriented  PULM: nl resp effort, no distress  PSYCH: Nl mood and affect     REVIEW OF LABS AND IMAGING:      Results for orders placed or performed in visit on 05/20/19   AMB POC URINALYSIS  DIP STICK AUTO W/O MICRO   Result Value Ref Range    Color (UA POC) Yellow     Clarity (UA POC) Clear     Glucose (UA POC) Negative Negative    Bilirubin (UA POC) Negative Negative    Ketones (UA POC) Negative Negative    Specific gravity (UA POC) 1.025 1.001 - 1.035    Blood (UA POC) 2+ Negative    pH (UA POC) 6.0 4.6 - 8.0    Protein (UA POC) 1+ Negative    Urobilinogen (UA POC) 0.2 mg/dL 0.2 - 1    Nitrites (UA POC) Negative Negative    Leukocyte esterase (UA POC) Negative Negative     RUS 08/02/2019  FINDINGS: ??   The right kidney measures 6.5 cm in length. The left kidney measures 5.4 cm in length. There is a cyst in the upper pole the right kidney which measures 2.8 cm x 2.8 cm x 2.1 cm. There is a cyst in the upper pole of the left kidney which measures 2.2 cm x 2.2 cm x 1.3 cm. There is a complex cyst in the upper pole of the left kidney which measures 8 mm x 8 mm x 7 mm. There is no hydronephrosis. There are no renal calcifications.   ??   There are no gross bladder masses. The prostate volume measures 17.4 cc. Post void examination of bladder was not performed.   _______________   ??   IMPRESSION   1. Small kidneys. Small bilateral renal cysts.   ??  CT Abd Pelv wo Cont 04/22/19  IMPRESSION   1. No findings to account for hematuria symptoms. The right lower quadrant transplant kidney appears normal.   2. Pericardial effusion may be slightly decreased.     Abd [place] 04/21/2018  IMPRESSION  Left pleural effusion.  Mild hepatomegaly, similar to prior.  Small echogenic kidneys consistent with history of chronic medical renal disease.  Multiple bilateral renal cysts, the largest on the left with evidence for mild complexity as described.  Usual tapering not demonstrated in the abdominal aorta distally, without aneurysmal dilatation.      Imaging Report Reviewed? YES, CT a/p   Images Reviewed?      YES      Type:  CT a/p    Other Lab Data Reviewed?   YES - UAs from 07/2018 - 04/2019, CBC and BMP 05/12/19.   Other Date  Reviewed? Transplant clinic and Nephrology referring notes.         John B. Malcolm, MD  [facility]    CC: Hasan, Faiq, MD  CC: [facility]        Medical documentation provided with the assistance of Willard Madrigal, medical scribe for Mark Castillo

## 2019-08-24 ENCOUNTER — Encounter: Attending: Urology | Primary: Internal Medicine

## 2019-08-24 NOTE — Progress Notes (Deleted)
ASSESSMENT:   {No Diagnosis Found}      -Microhematuria, chronic since transplant. +RBCs on UAs from 07/2018 - 04/2019. NCCT A/P 04/22/19: atrophic native kidneys, no suspicious upper tract or transplant kidney pathology,   No risk for significant pathology. No need for another cysto (had cysto last year). Will just monitor.  He'll let me know if he ever sees gross hematuria.    -S/p LDKTx 08/11/18. 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      PLAN:    Abdominal US of transplant and native kidneys in 3 mos.   Follow up in 3 months to review.            No chief complaint on file.      HISTORY OF PRESENT ILLNESS:  Mark Castillo is a 29 y.o. male who is referred back by [facility] transplant clinic for evaluation of microhematuria. 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 Was on HD for 5 years prior to transplant.  S/p LDKTx 08/19/18. He was last seen 07/2018 for cysto stent removal.     He denies history of gross hematuria. No dysuria. Asymptomatic for UTI. No f/c/n/v. No voiding complaints today. Denies flank or abdominal pain.      Review of Systems  Constitutional: Fever:   Skin: Rash:   HEENT: Hearing difficulty:   Eyes: Blurred vision:   Cardiovascular: Chest pain:   Respiratory: Shortness of breath:   Gastrointestinal: Nausea/vomiting:   Musculoskeletal: Back pain:   Neurological: Weakness:   Psychological: Memory loss:   Comments/additional findings:       Past Medical History:   Diagnosis Date   ??? Asthma     childhood   ??? Chronic diastolic heart failure ([place])    ??? ESRF (end stage renal failure) ([place])     DIALYSIS MWF- [facility]   ??? Hematuria    ??? Hyperparathyroidism ([place])    ??? Hypertension    ??? Hypertensive kidney disease with end-stage renal disease on dialysis [facility])    ??? Hypoxia    ??? Kidney transplant recipient    ??? Seizure [facility])        Past Surgical History:   Procedure Laterality Date   ??? HX RENAL TRANSPLANT  08/11/2018    Living unrelated kidney transplant into right iliac fossa with preparation of living donor kidney   ??? HX UROLOGICAL       KIDNEY BX   ??? VASCULAR SURGERY PROCEDURE UNLIST      AVF       Social History     Tobacco Use   ??? Smoking status: Former Smoker     Packs/day: 0.10     Years: 5.00     Pack years: 0.50     Types: Cigarettes     Quit date: 10/19/2012     Years since quitting: 6.8   ??? Smokeless tobacco: Never Used   Substance Use Topics   ??? Alcohol use: Not Currently   ??? Drug use: Yes     Types: Marijuana       Allergies   Allergen Reactions   ??? Sevelamer Carbonate Other (comments) and Swelling     Swelling of lips and face (powdered version)  Powdered form only , made lips swell  Can take pill ??With no problems     ??? Bromelains Unknown (comments)       Family History   Problem Relation Age of Onset   ??? Arthritis Mother    ??? Hypertension Mother    ??? Heart Attack Father    ???  Hypertension Paternal Grandfather        Current Outpatient Medications   Medication Sig Dispense Refill   ??? cholecalciferol (VITAMIN D3) (1000 Units /25 mcg) tablet Take 1,000 Units by mouth daily.     ??? famotidine (PEPCID) 20 mg tablet Take 20 mg by mouth daily.     ??? mycophenolic acid (MYFORTIC) 180 mg delayed release tablet Take 360 mg by mouth four (4) times daily.     ??? predniSONE (DELTASONE) 20 mg tablet      ??? sodium bicarbonate 650 mg tablet Take 650 mg by mouth three (3) times daily.     ??? trimethoprim-sulfamethoxazole (BACTRIM DS, SEPTRA DS) 160-800 mg per tablet Take 1 Tab by mouth.     ??? tacrolimus 4 mg Tb24 Take 12 mg by mouth.     ??? minoxidiL (LONITEN) 2.5 mg tablet Take 2.5 mg by mouth two (2) times a day.     ??? cloNIDine HCL (CATAPRES) 0.3 mg tablet Take 0.3 mg by mouth two (2) times a day.     ??? carvediloL (COREG) 25 mg tablet Take 25 mg by mouth two (2) times a day.         PHYSICAL EXAMINATION:   There were no vitals taken for this visit.    GEN: NAD, alert and oriented  PULM: nl resp effort, no distress  PSYCH: Nl mood and affect     REVIEW OF LABS AND IMAGING:      Results for orders placed or performed in visit on 05/20/19   AMB POC URINALYSIS  DIP STICK AUTO W/O MICRO   Result Value Ref Range    Color (UA POC) Yellow     Clarity (UA POC) Clear     Glucose (UA POC) Negative Negative    Bilirubin (UA POC) Negative Negative    Ketones (UA POC) Negative Negative    Specific gravity (UA POC) 1.025 1.001 - 1.035    Blood (UA POC) 2+ Negative    pH (UA POC) 6.0 4.6 - 8.0    Protein (UA POC) 1+ Negative    Urobilinogen (UA POC) 0.2 mg/dL 0.2 - 1    Nitrites (UA POC) Negative Negative    Leukocyte esterase (UA POC) Negative Negative     RUS 08/02/2019  FINDINGS: ??   The right kidney measures 6.5 cm in length. The left kidney measures 5.4 cm in length. There is a cyst in the upper pole the right kidney which measures 2.8 cm x 2.8 cm x 2.1 cm. There is a cyst in the upper pole of the left kidney which measures 2.2 cm x 2.2 cm x 1.3 cm. There is a complex cyst in the upper pole of the left kidney which measures 8 mm x 8 mm x 7 mm. There is no hydronephrosis. There are no renal calcifications.   ??   There are no gross bladder masses. The prostate volume measures 17.4 cc. Post void examination of bladder was not performed.   _______________   ??   IMPRESSION   1. Small kidneys. Small bilateral renal cysts.   ??  CT Abd Pelv wo Cont 04/22/19  IMPRESSION   1. No findings to account for hematuria symptoms. The right lower quadrant transplant kidney appears normal.   2. Pericardial effusion may be slightly decreased.     Abd [place] 04/21/2018  IMPRESSION  Left pleural effusion.  Mild hepatomegaly, similar to prior.  Small echogenic kidneys consistent with history of chronic medical renal disease.  Multiple bilateral renal cysts, the largest on the left with evidence for mild complexity as described.  Usual tapering not demonstrated in the abdominal aorta distally, without aneurysmal dilatation.      Imaging Report Reviewed? YES, CT a/p   Images Reviewed?      YES      Type:  CT a/p    Other Lab Data Reviewed?   YES - UAs from 07/2018 - 04/2019, CBC and BMP 05/12/19.   Other Date  Reviewed? Transplant clinic and Nephrology referring notes.         Ruben Reason, MD  [facility] [place] [place]    CC: Collene Mares, MD  CC: [facility]        Medical documentation provided with the assistance of Mizuki Hoel, medical scribe for Mallorie Norrod

## 2021-04-18 ENCOUNTER — Encounter

## 2021-04-18 ENCOUNTER — Ambulatory Visit
Admit: 2021-04-18 | Discharge: 2021-04-18 | Payer: MEDICARE | Attending: Physician Assistant | Primary: Internal Medicine

## 2021-04-18 DIAGNOSIS — T8612 Kidney transplant failure: Secondary | ICD-10-CM

## 2021-04-18 NOTE — Addendum Note (Signed)
Addendum Note  by Elisha Headland at 04/18/21 1420                Author: Elisha Headland  Service: --  Author TypePlacido Sou: 04/18/21 1616  Encounter Date: 04/18/2021  Status: Signed          Editor: Elisha Headland [facility])          Addended by: Elisha Headland on: 04/18/2021 04:16 PM    Modules accepted: Orders

## 2021-04-18 NOTE — Progress Notes (Signed)
Progress Notes by Roselind Rily, PA at 04/18/21 1420                Author: Roselind Rily, PA  Service: --  Author Type: Physician Assistant       Filed: 04/18/21 1546  Encounter Date: 04/18/2021  Status: Signed          Editor: Roselind Rily, PA (Physician Assistant)                           ASSESSMENT:              ICD-10-CM  ICD-9-CM             1.  Failed kidney transplant   T86.12  996.81  REFERRAL TO TRANSPLANT SURGERY                  2.  Right lower quadrant abdominal pain   R10.31  789.03                                   PLAN:     Reviewed ER notes   Reviewed imaging studies   Discussed with Dr. Judie Petit who recommends pt be referred back to transplant team, Dr. Criss Alvine to discuss removal of failed renal transplant.    Referral sent.   Contact information provided to pt today. I recommend pt contact their office early next week to schedule.                   Chief Complaint       Patient presents with        ?  Flank Pain              HISTORY OF PRESENT ILLNESS:  Mark Castillo  is a 31 y.o. male who is referred  back by [facility] ER for RLQ pain and possible removal of failed transplant kidney.       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       Last seen by Dr. Judie Petit 05/20/19. At that time pt had been referred by transplant clinic for evaluation of microhematuria. Prior to that he was last seen 07/2018 for cysto stent removal.    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 Was on HD for 5 years prior to transplant.  S/p LDKTx 08/19/18.  Dr. Judie Petit recommended pt RTC in 3 months with RUS prior however this was never completed.       Pt presents today after two recent ER visits for RLQ Pain. CT done 02/06/21 and again 04/06/21 showing mild perinephric stranding near transplanted kidney. No leukocytosis or fever. Pt recently finished Cipro course and was on Clindamycin at time of most  recent ER visit.       Pt presents today to discuss removal of his failed transplanted kidney.       Patient  presents today with improvement in RLQ pain.  Patient states 2 recent courses of antibiotics significantly and helped pain.  Pain began a few months ago. Patient states pain was sharp and unbearable, now dull and achy.  Only uncomfortable if palpating  area with moderate to deep pressure.  Patient finished course of antibiotics 5 days ago and states pain has not worsened.  No F/C, N/V. Patient notes he has been back on dialysis for the past 6 or 7 months.  Was making  urine up until a few months ago.   States urine was progressively getting darker.  No hematuria.           Review of Systems   Constitutional: Fever: No   Skin: Rash: No   HEENT: Hearing difficulty: No   Eyes: Blurred vision: No   Cardiovascular: Chest pain: No   Respiratory: Shortness of breath: No   Gastrointestinal: Nausea/vomiting: No   Musculoskeletal: Back pain: No   Neurological: Weakness: No   Psychological: Memory loss: No   Comments/additional findings:            Past Medical History:        Diagnosis  Date         ?  Asthma            childhood         ?  Chronic diastolic heart failure (HCC)       ?  ESRF (end stage renal failure) (HCC)            DIALYSIS MWF- [facility]         ?  Hematuria       ?  Hyperparathyroidism (HCC)       ?  Hypertension       ?  Hypertensive kidney disease with end-stage renal disease on dialysis [facility])       ?  Hypoxia       ?  Kidney transplant recipient           ?  Seizure [facility])               Past Surgical History:         Procedure  Laterality  Date          ?  HX RENAL TRANSPLANT    08/11/2018          Living unrelated kidney transplant into right iliac fossa with preparation of living donor kidney          ?  HX UROLOGICAL              KIDNEY BX          ?  PR UNLISTED PROCEDURE VASCULAR SURGERY              AVF             Social History          Tobacco Use         ?  Smoking status:  Former              Packs/day:  0.10         Years:  5.00         Pack years:  0.50         Types:  Cigarettes          Quit date:  10/19/2012         Years since quitting:  8.5         ?  Smokeless tobacco:  Never       Substance Use Topics         ?  Alcohol use:  Not Currently     ?  Drug use:  Yes              Types:  Marijuana             Allergies        Allergen  Reactions         ?  Sevelamer Carbonate  Other (comments) and Swelling             Swelling of lips and face (powdered version)   Powdered form only , made lips swell   Can take pill  With no problems            ?  Bromelains  Unknown (comments)             Family History         Problem  Relation  Age of Onset          ?  Arthritis  Mother       ?  Hypertension  Mother       ?  Heart Attack  Father            ?  Hypertension  Paternal Grandfather               Current Outpatient Medications          Medication  Sig  Dispense  Refill           ?  cloNIDine HCL (CATAPRES) 0.3 mg tablet  Take 0.3 mg by mouth two (2) times a day.         ?  hydrALAZINE (APRESOLINE) 100 mg tablet  Take 100 mg by mouth three (3) times daily.               ?  carvediloL (COREG) 25 mg tablet  Take 25 mg by mouth two (2) times a day.               PHYSICAL EXAMINATION:    Visit Vitals      Ht  6\' 2"  (1.88 m)     Wt  210 lb (95.3 kg)        BMI  26.96 kg/m??              GEN: NAD, alert and oriented   PULM: nl resp effort, no distress   PSYCH: Nl mood and affect    GI: abd is soft, nondistended. Some TTP over transplanted kidney, RLQ. No guarding.       REVIEW OF LABS AND IMAGING:          Results for orders placed or performed in visit on 05/20/19     AMB POC URINALYSIS DIP STICK AUTO W/O MICRO         Result  Value  Ref Range            Color (UA POC)  Yellow         Clarity (UA POC)  Clear         Glucose (UA POC)  Negative  Negative       Bilirubin (UA POC)  Negative  Negative       Ketones (UA POC)  Negative  Negative       Specific gravity (UA POC)  1.025  1.001 - 1.035       Blood (UA POC)  2+  Negative       pH (UA POC)  6.0  4.6 - 8.0       Protein (UA POC)  1+  Negative        Urobilinogen (UA POC)  0.2 mg/dL  0.2 - 1       Nitrites (UA POC)  Negative  Negative            Leukocyte esterase (UA POC)  Negative  Negative  CT Abd Pelv wo Cont 04/22/19   IMPRESSION   1. No findings to account for hematuria symptoms. The right lower quadrant transplant kidney appears normal.   2. Pericardial effusion may be slightly decreased.       Abd US 04/21/2018   IMPRESSION  Left pleural effusion.  Mild hepatomegaly, similar to prior.  Small echogenic kidneys consistent with history of chronic medical renal disease. Multiple bilateral renal cysts,  the largest on the left with evidence for mild complexity as described.  Usual tapering not demonstrated in the abdominal aorta distally, without aneurysmal dilatation.                  Mark Lampron A. Christena FlakeGiovanelli, PA-C   [facility]    [address]    [place], [place] [id number]    [phone] (office)

## 2021-08-21 ENCOUNTER — Inpatient Hospital Stay
Admit: 2021-08-21 | Discharge: 2021-08-21 | Disposition: A | Discharge status: Home / Self Care | Payer: Medicare Other | Attending: Internal Medicine | Admitting: Internal Medicine

## 2021-08-21 DIAGNOSIS — D500 Iron deficiency anemia secondary to blood loss (chronic): Secondary | ICD-10-CM

## 2021-08-21 LAB — POC CHEM 8
BUN: 35 mg/dl — ABNORMAL HIGH (ref 7–25)
Calcium, Ionized: 4.6 mg/dL (ref 4.40–5.40)
Chloride: 99 mEq/L (ref 98–107)
Creatinine: 10.2 mg/dl — ABNORMAL HIGH (ref 0.6–1.3)
Glucose: 85 mg/dL (ref 74–106)
Hematocrit: 43 % (ref 40–54)
Hemoglobin: 14.6 gm/dl (ref 13.0–17.2)
Potassium: 4.4 mEq/L (ref 3.5–4.9)
Sodium: 139 mEq/L (ref 136–145)
Total CO2: 27 mmol/L (ref 21–32)

## 2021-08-21 MED ORDER — SODIUM CHLORIDE 0.9 % IV SOLN
0.9 % | INTRAVENOUS | Status: DC | PRN
Start: 2021-08-21 — End: 2021-08-21
  Administered 2021-08-21: 18:00:00 via INTRAVENOUS

## 2021-08-21 MED ORDER — NIFEDIPINE 10 MG PO CAPS
10 MG | Freq: Every day | ORAL | Status: DC
Start: 2021-08-21 — End: 2021-08-21

## 2021-08-21 MED ORDER — NORMAL SALINE FLUSH 0.9 % IV SOLN
0.9 % | Freq: Two times a day (BID) | INTRAVENOUS | Status: DC
Start: 2021-08-21 — End: 2021-08-21

## 2021-08-21 MED ORDER — HYDRALAZINE HCL 20 MG/ML IJ SOLN
20 MG/ML | INTRAMUSCULAR | Status: AC
Start: 2021-08-21 — End: 2021-08-21
  Administered 2021-08-21: 20:00:00 10 mg via INTRAVENOUS

## 2021-08-21 MED ORDER — CARVEDILOL 25 MG PO TABS
25 MG | Freq: Two times a day (BID) | ORAL | Status: DC
Start: 2021-08-21 — End: 2021-08-21

## 2021-08-21 MED ORDER — NORMAL SALINE FLUSH 0.9 % IV SOLN
0.9 % | INTRAVENOUS | Status: DC | PRN
Start: 2021-08-21 — End: 2021-08-21

## 2021-08-21 MED ORDER — HYDROCORTISONE ACETATE 25 MG RE SUPP
25 MG | Freq: Two times a day (BID) | RECTAL | 0 refills | Status: AC
Start: 2021-08-21 — End: 2021-09-04

## 2021-08-21 MED ORDER — ACETAMINOPHEN 325 MG PO TABS
325 MG | Freq: Four times a day (QID) | ORAL | Status: DC | PRN
Start: 2021-08-21 — End: 2021-08-21

## 2021-08-21 MED ORDER — LACTATED RINGERS IV SOLN
INTRAVENOUS | Status: DC
Start: 2021-08-21 — End: 2021-08-21

## 2021-08-21 MED ORDER — ONDANSETRON 4 MG PO TBDP
4 MG | Freq: Three times a day (TID) | ORAL | Status: DC | PRN
Start: 2021-08-21 — End: 2021-08-21

## 2021-08-21 MED ORDER — PROPOFOL 1000 MG/100ML IV EMUL
1000 MG/100ML | INTRAVENOUS | Status: DC | PRN
Start: 2021-08-21 — End: 2021-08-21
  Administered 2021-08-21 (×12): 50 via INTRAVENOUS

## 2021-08-21 MED ORDER — ONDANSETRON HCL 4 MG/2ML IJ SOLN
4 MG/2ML | Freq: Four times a day (QID) | INTRAMUSCULAR | Status: DC | PRN
Start: 2021-08-21 — End: 2021-08-21

## 2021-08-21 MED ORDER — ACETAMINOPHEN 650 MG RE SUPP
650 MG | Freq: Four times a day (QID) | RECTAL | Status: DC | PRN
Start: 2021-08-21 — End: 2021-08-21

## 2021-08-21 MED ORDER — CLONIDINE HCL 0.1 MG PO TABS
0.1 MG | Freq: Two times a day (BID) | ORAL | Status: DC
Start: 2021-08-21 — End: 2021-08-21

## 2021-08-21 MED ORDER — TACROLIMUS ER 4 MG PO TB24
4 MG | Freq: Every day | ORAL | Status: DC
Start: 2021-08-21 — End: 2021-08-21

## 2021-08-21 MED ORDER — PREDNISONE 20 MG PO TABS
20 MG | Freq: Every day | ORAL | Status: DC
Start: 2021-08-21 — End: 2021-08-21

## 2021-08-21 MED ORDER — BENZOCAINE 20 % MT SOLN
20 % | OROMUCOSAL | Status: DC | PRN
Start: 2021-08-21 — End: 2021-08-21
  Administered 2021-08-21: 18:00:00 3 via OROMUCOSAL

## 2021-08-21 MED ORDER — SODIUM CHLORIDE 0.9 % IV SOLN
0.9 % | INTRAVENOUS | Status: DC | PRN
Start: 2021-08-21 — End: 2021-08-21

## 2021-08-21 MED ORDER — POLYETHYLENE GLYCOL 3350 17 G PO PACK
17 g | Freq: Every day | ORAL | Status: DC | PRN
Start: 2021-08-21 — End: 2021-08-21

## 2021-08-21 MED ORDER — CLONIDINE HCL 0.2 MG PO TABS
0.2 MG | Freq: Once | ORAL | Status: DC
Start: 2021-08-21 — End: 2021-08-21

## 2021-08-21 MED ORDER — DEXMEDETOMIDINE HCL 200 MCG/2ML IV SOLN
200 MCG/2ML | INTRAVENOUS | Status: DC | PRN
Start: 2021-08-21 — End: 2021-08-21
  Administered 2021-08-21 (×2): 8 via INTRAVENOUS

## 2021-08-21 MED ORDER — MINOXIDIL 2.5 MG PO TABS
2.5 MG | Freq: Two times a day (BID) | ORAL | Status: DC
Start: 2021-08-21 — End: 2021-08-21

## 2021-08-21 MED ORDER — LIDOCAINE HCL 100 MG/5ML IJ SOSY
100 MG/5ML | INTRAMUSCULAR | Status: DC | PRN
Start: 2021-08-21 — End: 2021-08-21
  Administered 2021-08-21 (×2): 50 via INTRAVENOUS

## 2021-08-21 MED ORDER — HYDRALAZINE HCL 20 MG/ML IJ SOLN
20 MG/ML | INTRAMUSCULAR | Status: AC
Start: 2021-08-21 — End: 2021-08-21
  Administered 2021-08-21: 19:00:00 10 mg via INTRAVENOUS

## 2021-08-21 MED ORDER — HYDRALAZINE HCL 50 MG PO TABS
50 MG | Freq: Three times a day (TID) | ORAL | Status: DC
Start: 2021-08-21 — End: 2021-08-21

## 2021-08-21 MED FILL — LACTATED RINGERS IV SOLN: INTRAVENOUS | Qty: 1000

## 2021-08-21 MED FILL — HYDRALAZINE HCL 20 MG/ML IJ SOLN: 20 MG/ML | INTRAMUSCULAR | Qty: 1

## 2021-08-21 MED FILL — CLONIDINE HCL 0.2 MG PO TABS: 0.2 MG | ORAL | Qty: 1

## 2021-08-21 MED FILL — CLONIDINE HCL 0.1 MG PO TABS: 0.1 MG | ORAL | Qty: 1

## 2021-08-21 MED FILL — HYDRALAZINE HCL 50 MG PO TABS: 50 MG | ORAL | Qty: 2

## 2021-08-21 NOTE — Discharge Summary (Signed)
DISCHARGE SUMMARY     Patient Name: Mark Castillo  Medical Record Number: [id number]  Date of Birth: 01/21/1991  Discharge Provider: Lawana Chambers, MD  Primary Care Provider: None None    Admit date: 08/21/2021  Discharge Date:  08/21/2021 Time: 7:11 PM  Discharge Disposition: Home  Code Status: Full Code    Follow-up appointments:   .Dellis Anes, MD  [address]  [address] [place] [address]  [phone]    Follow up in 4 month(s)      None None    Follow up in 1 week(s)          Follow-up recommendations:   Follow-up PCP 1 week.  Discharge diagnosis:  Primary diagnosis             Hypertensive urgency                                                                    Discharge Medications:               Discharge Medication List as of 08/21/2021  2:55 PM        START taking these medications    Details   hydrocortisone (ANUSOL-HC) 25 MG suppository Place 1 suppository rectally in the morning and 1 suppository in the evening. Do all this for 14 days., Disp-28 suppository, R-0Print               Discharge Medication List as of 08/21/2021  2:55 PM                       Discharge Medication List as of 08/21/2021  2:55 PM        CONTINUE these medications which have NOT CHANGED    Details   Sucroferric Oxyhydroxide (VELPHORO PO) Take by mouthHistorical Med      NIFEDIPINE PO Take by mouthHistorical Med      HYDRALAZINE HCL PO Take by mouthHistorical Med      carvedilol (COREG) 25 MG tablet Take 1 tablet by mouth 2 times dailyHistorical Med      cloNIDine (CATAPRES) 0.3 MG tablet Take 1 tablet by mouth 2 times dailyHistorical Med      minoxidil (LONITEN) 2.5 MG tablet Take 1 tablet by mouth 2 times dailyHistorical Med      predniSONE (DELTASONE) 20 MG tablet ceived the following from [facility]: Outside name: predniSONE (DELTASONE) 20 mg tabletHistorical Med      Tacrolimus ER 4 MG TB24 Take 3 tablets by mouthHistorical Med      vitamin D (CHOLECALCIFEROL) 25 MCG (1000 UT) TABS tablet  Take 1,000 Units by mouth dailyHistorical Med      famotidine (PEPCID) 20 MG tablet Take 20 mg by mouth dailyHistorical Med      mycophenolate (MYFORTIC) 180 MG DR tablet Take 360 mg by mouth 4 times dailyHistorical Med      sodium bicarbonate 650 MG tablet Take 650 mg by mouth 3 times dailyHistorical Med      sulfamethoxazole-trimethoprim (BACTRIM DS;SEPTRA DS) 800-160 MG per tablet Take 1 tablet by mouthHistorical Med  Discharge Medication List as of 08/21/2021  2:55 PM           Discharge Diet:            Diet: cardiac diet    Consultants: none    Procedures:   Procedure(s):  ESOPHAGOGASTRODUODENOSCOPY w/BX  COLONOSCOPY DIAGNOSTIC  No results found for this or any previous visit.        Recent Results (from the past 48 hour(s))   POC CHEM 8    Collection Time: 08/21/21  1:22 PM   Result Value Ref Range    Sodium 139 136 - 145 mEq/L    Potassium 4.4 3.5 - 4.9 mEq/L    Chloride 99 98 - 107 mEq/L    Total CO2 27 21 - 32 mmol/L    Glucose 85 74 - 106 mg/dL    BUN 35 (H) 7 - 25 mg/dl    Creatinine 45.4 (H) 0.6 - 1.3 mg/dl    Hematocrit 43 40 - 54 %    Hemoglobin 14.6 13.0 - 17.2 gm/dl    Calcium, Ionized 0.98 4.40 - 5.40 mg/dL         LFTS:No results for input(s): AST, ALT, PROT, LABALBU, BILITOT, ALKPHOS, LIPASE in the last 72 hours.    Invalid input(s): BU    Cardiac Injury Profile: No results for input(s): CKTOTAL, CKMB, TROPONINI in the last 72 hours.   Labs in Last 3 months: No results found for: TSH, VITD25, PSA, INR, GLUF, NTPROBNP, LABA1C, MALBCR       Lipids: No results for input(s): CHOL, HDL in the last 72 hours.    Invalid input(s): LDLCALCU  INR: No results for input(s): INR in the last 72 hours.        Microbiology-  Urine Cx: No results found for: LABURIN  Blood Cx: No results found for: BC  Sputum Cx: No results found for: RESPCULTURE  Gram Stain: No results found for: LABGRAM  PNA PCR: No results found for: PNPCRPNL  COVID19: No results found for: COVID19  Legionella Ag: No  results found for: LEGIONELLAANTIGEN  Strep Ag: No results for input(s): STREPNEUMAGU in the last 72 hours.    Radiology reports as per the Radiologist  Radiology: No results found.           No results found for this or any previous visit.    History of presenting illness:( Per admitting M.D.)   DARRIO SPHAR is a 31 y.o. male with a history of ESRD on dialysis, hypertension, asthma came for endoscopic procedure.  Hospitalist was called because patient's blood pressure was uncontrolled after procedure.  Blood pressure was 229/116.  Patient said he did take his morning medications before dialysis.  No chest pain or shortness of breath.  Patient said he used to have high blood pressure but since his medication was changed at [facility] his blood pressure has been staying better at home.    Hospital course:  Hypertensive urgency  ESRD on dialysis  Asthma  Iron deficiency anemia status post EGD and colonoscopy     Plan:   Patient with uncontrolled hypertension postoperatively.  Blood pressure 229/116.  Patient received hydralazine 20 mg IV.  Admitted  for observation.  Resumed clonidine, Coreg, minoxidil, nifedipine.  Subsequently feeling better.  Blood pressure improving.  Patient adamant to leave today.  Offered patient to stay overnight for close monitoring but he wants to go home today.  Discharge plan discussed with patient family at bedside.  Advised to return  to hospital if any worsening condition.  Patient to follow with PCP and GI .  Continue dialysis as scheduled.      Vitals on Discharge:  BP (S) (!) 132/97   Pulse 83   Temp 97.7 F (36.5 C) (Oral)   Resp 16   Ht 6\' 2"  (1.88 m)   Wt 215 lb 9.8 oz (97.8 kg)   SpO2 95%   BMI 27.68 kg/m     Physical Exam:  Constitutional: Awake and alert, NAD  HENT: atraumatic, normocephalic, oropharynx clear    Eyes:   conjunctiva normal  Neck:   supple and trachea normal  Cardiovascular:  Regular rate and rhythm, heart sounds normal, intact distal  pulses  Pulmonary/Chest Wall: breath sounds normal and effort normal  Abdominal:      appearance normal, soft, non-tender upon palpation,bowel sounds normal.  Neurological:   awake, alert and oriented, CN intact, no focal neuro deficits, moves all extremities equally .  Extremities:   No clubbing or cyanosis. Pedal pulses 2+ b/l.       Discharge condition:  Stable    Discharge activity and restrictions: As advised by GI    Time spent with discharging patient:   Discharge plan discussed with pt. All questions answered. Need for compliance with medicine and follow up emphasized. Pt verbalized the understanding of care. Time spent 15 minutes.      Lawana Chambers, MD  08/21/2021  7:11 PM    Copies: None None

## 2021-08-21 NOTE — H&P (Signed)
Admission History and Physical      HILBERTO BULKLEY  [birth year]  [id number]  PCP: None None    Chief complaint  Uncontrolled blood pressure      HPI:      Mark Castillo is a 31 y.o. male with a history of ESRD on dialysis, hypertension, asthma came for endoscopic procedure.  Hospitalist was called because patient's blood pressure was uncontrolled after procedure.  Blood pressure was 229/116.  Patient said he did take his morning medications before dialysis.  No chest pain or shortness of breath.  Patient said he used to have high blood pressure but since his medication was changed at [facility] his blood pressure has been staying better at home.       Past Medical History:   Diagnosis Date    Asthma     childhood    Chronic diastolic heart failure (HCC)     ESRF (end stage renal failure) (HCC)     DIALYSIS MWF- [facility]    Hematuria     Hyperparathyroidism (HCC)     Hypertension     Hypertensive kidney disease with end-stage renal disease on dialysis [facility])     Hypoxia     Kidney transplant recipient     Seizure [facility])      Past Surgical History:   Procedure Laterality Date    KIDNEY TRANSPLANT  08/11/2018    Living unrelated kidney transplant into right iliac fossa with preparation of living donor kidney    UROLOGICAL SURGERY      KIDNEY BX    VASCULAR SURGERY      AVF     Social History     Socioeconomic History    Marital status: Single     Spouse name: Not on file    Number of children: Not on file    Years of education: Not on file    Highest education level: Not on file   Occupational History    Not on file   Tobacco Use    Smoking status: Former     Packs/day: 0.10     Types: Cigarettes     Quit date: 10/19/2012     Years since quitting: 8.8    Smokeless tobacco: Never   Substance and Sexual Activity    Alcohol use: Not Currently    Drug use: Yes     Types: Marijuana [facility])    Sexual activity: Not on file   Other Topics Concern    Not on file   Social History Narrative    Not on file     Social Determinants  of Health     Financial Resource Strain: Not on file   Food Insecurity: Not on file   Transportation Needs: Not on file   Physical Activity: Not on file   Stress: Not on file   Social Connections: Not on file   Intimate Partner Violence: Not on file   Housing Stability: Not on file     Family History   Problem Relation Age of Onset    Heart Attack Father     Hypertension Mother     Arthritis Mother     Hypertension Paternal Grandfather      Allergies   Allergen Reactions    Sevelamer Carbonate Other (See Comments) and Swelling     Swelling of lips and face (powdered version)  Powdered form only , made lips swell  Can take pill  With no problems    Bromelains  Other reaction(s): Unknown (comments)       Home Medications:     Prior to Admission medications    Medication Sig Start Date End Date Taking? Authorizing Provider   Sucroferric Oxyhydroxide (VELPHORO PO) Take by mouth   Yes Historical Provider, MD   NIFEDIPINE PO Take by mouth   Yes Historical Provider, MD   HYDRALAZINE HCL PO Take by mouth   Yes Historical Provider, MD   hydrocortisone (ANUSOL-HC) 25 MG suppository Place 1 suppository rectally in the morning and 1 suppository in the evening. Do all this for 14 days. 08/21/21 09/04/21 Yes Dellis Anes, MD   carvedilol (COREG) 25 MG tablet Take 1 tablet by mouth 2 times daily    Ar Automatic Reconciliation   vitamin D (CHOLECALCIFEROL) 25 MCG (1000 UT) TABS tablet Take 1,000 Units by mouth daily  Patient not taking: Reported on 08/21/2021 03/11/19   Ar Automatic Reconciliation   cloNIDine (CATAPRES) 0.3 MG tablet Take 1 tablet by mouth 2 times daily    Ar Automatic Reconciliation   famotidine (PEPCID) 20 MG tablet Take 20 mg by mouth daily  Patient not taking: Reported on 08/21/2021 08/15/18   Ar Automatic Reconciliation   minoxidil (LONITEN) 2.5 MG tablet Take 1 tablet by mouth 2 times daily    Ar Automatic Reconciliation   mycophenolate (MYFORTIC) 180 MG DR tablet Take 360 mg by mouth 4 times daily  Patient  not taking: Reported on 08/21/2021 09/08/18   Ar Automatic Reconciliation   predniSONE (DELTASONE) 20 MG tablet ceived the following from [facility]: Outside name: predniSONE (DELTASONE) 20 mg tablet 08/18/18   Ar Automatic Reconciliation   sodium bicarbonate 650 MG tablet Take 650 mg by mouth 3 times daily  Patient not taking: Reported on 08/21/2021 09/01/18   Ar Automatic Reconciliation   sulfamethoxazole-trimethoprim (BACTRIM DS;SEPTRA DS) 800-160 MG per tablet Take 1 tablet by mouth  Patient not taking: Reported on 08/21/2021 09/02/18   Ar Automatic Reconciliation   Tacrolimus ER 4 MG TB24 Take 3 tablets by mouth 08/25/18   Ar Automatic Reconciliation        Review of Systems   Constitutional: Negative for fever, chills, weight loss, malaise/fatigue and diaphoresis.   HENT: Negative for ear pain, congestion and sore throat.   Eyes: Negative for blurred vision, double vision and redness.   Respiratory: Negative for shortness of breath. Negative for cough, hemoptysis, sputum production and wheezing.   Cardiovascular: Negative for leg swelling. Negative for chest pain, palpitations, orthopnea, claudication and PND.   Gastrointestinal: Negative for heartburn, nausea, abdominal pain, diarrhea, blood in stool and melena.   Genitourinary: Negative for dysuria, urgency, frequency and hematuria.   Musculoskeletal: Negative for myalgias, back pain, joint pain and falls.   Skin: Negative for rash.   Neurological: Negative for dizziness, tingling, tremors, speech change, seizures, loss of consciousness, weakness and headaches.   Endo/Heme/Allergies: Negative for polydipsia. Does not bruise/bleed easily.   Psychiatric/Behavioral: Negative for hallucinations and memory loss. The patient does not have insomnia    Physical Assessment:     Patient Vitals for the past 12 hrs:   Temp Pulse Resp BP SpO2   08/21/21 1627 -- -- -- (!) 229/116 95 %   08/21/21 1612 -- -- -- (!) 186/113 92 %   08/21/21 1555 -- -- -- (!) 194/115 --    08/21/21 1542 -- -- -- (!) 194/115 94 %   08/21/21 1527 -- -- -- (!) 188/115 94 %  08/21/21 1522 -- -- -- (!) 186/112 --   08/21/21 1500 -- 77 -- (!) 176/96 --   08/21/21 1439 -- 80 16 (!) 193/99 100 %   08/21/21 1257 97.7 F (36.5 C) 85 18 -- 97 %       Constitutional:  oriented to person, place, and time and well-developed, well-nourished, and in no distress.   HENT:   Head: Normocephalic and atraumatic.   Nose: Nose normal.   Mouth/Throat: Oropharynx is clear and moist.   Eyes: Conjunctivae and EOM are normal. Pupils are equal, round, and reactive to light.   Neck: Neck supple.   Cardiovascular: Normal rate, regular rhythm, normal heart sounds and intact distal pulses.   Pulmonary/Chest: Effort normal and breath sounds normal.   Abdominal: Soft. Bowel sounds are normal.   Musculoskeletal: Normal range of motion. No clubbing or cyanosis. Pedal pulses 2+ b/l.  Capillary refill normal.  Neurological: Alert and oriented to person, place, and time. Gait normal.   Skin: Skin is warm and dry.   Psychiatric: Memory, affect and judgment normal      Recent Results (from the past 24 hour(s))   POC CHEM 8    Collection Time: 08/21/21  1:22 PM   Result Value Ref Range    Sodium 139 136 - 145 mEq/L    Potassium 4.4 3.5 - 4.9 mEq/L    Chloride 99 98 - 107 mEq/L    Total CO2 27 21 - 32 mmol/L    Glucose 85 74 - 106 mg/dL    BUN 35 (H) 7 - 25 mg/dl    Creatinine 84.1 (H) 0.6 - 1.3 mg/dl    Hematocrit 43 40 - 54 %    Hemoglobin 14.6 13.0 - 17.2 gm/dl    Calcium, Ionized 3.24 4.40 - 5.40 mg/dL         Cardiac Injury Profile: No results for input(s): CKTOTAL, CKMB, TROPONINI in the last 72 hours.   Labs in Last 3 months: No results found for: TSH, VITD25, PSA, INR, GLUF, NTPROBNP, LABA1C, MALBCR       Lipids: No results for input(s): CHOL, HDL in the last 72 hours.    Invalid input(s): LDLCALCU  INR: No results for input(s): INR in the last 72 hours.    Recent Results (from the past 48 hour(s))   POC CHEM 8    Collection Time:  08/21/21  1:22 PM   Result Value Ref Range    Sodium 139 136 - 145 mEq/L    Potassium 4.4 3.5 - 4.9 mEq/L    Chloride 99 98 - 107 mEq/L    Total CO2 27 21 - 32 mmol/L    Glucose 85 74 - 106 mg/dL    BUN 35 (H) 7 - 25 mg/dl    Creatinine 40.1 (H) 0.6 - 1.3 mg/dl    Hematocrit 43 40 - 54 %    Hemoglobin 14.6 13.0 - 17.2 gm/dl    Calcium, Ionized 0.27 4.40 - 5.40 mg/dL        Microbiology-  Urine Cx: No results found for: LABURIN  Blood Cx: No results found for: BC  Sputum Cx: No results found for: RESPCULTURE  Gram Stain: No results found for: LABGRAM  PNA PCR: No results found for: PNPCRPNL  COVID19: No results found for: COVID19  Legionella Ag: No results found for: LEGIONELLAANTIGEN  Strep Ag: No results for input(s): STREPNEUMAGU in the last 72 hours.    Radiology reports as per the Radiologist  Radiology:  No results found.           No results found for this or any previous visit.              Impression:   Hypertensive urgency  ESRD on dialysis  Asthma  Iron deficiency anemia status post EGD and colonoscopy    Plan:   Patient with uncontrolled hypertension postoperatively.  Blood pressure 229/116.  We will give IV hydralazine 20 mg.  Admit for observation.  Resume clonidine, Coreg, minoxidil, nifedipine.  Full liquid diet.  Keep on telemetry.  Plan discussed with patient and family.  CODE STATUS full code.  DVT prophylaxis SCDs.    I independently reviewed the imaging studies done on the patient and agree with interpretation done by the radiologist     I discussed the patient with the emergency room physician about the necessity to admit the patient to the hospital.     I reviewed patient's history previous chart and laboratory results during this admission while making the diagnosis and plan for admission to the hospital      Dragon medical dictation software was used for portions of this report. Unintended errors may occur.   Lawana Chambers, MD  08/21/2021 4:49 PM

## 2021-08-21 NOTE — Other (Signed)
08/21/21 1715   Vital Signs   BP (!) (S)  132/97          Per dr Phillis Knack okay for patient d/c home.  Dr. Dory Peru also okay with d/c home after speaking with patient and patients mom     Pt will take scheduled clonidine at home

## 2021-08-21 NOTE — Discharge Instructions (Signed)
For the next 12 hours you should not:   1. drive   2. drink alcohol   3. operate any machinery   4. engage in activities that require mental sharpness or manual dexterity such as     cooking   5. take any drugs other than those prescribed by a physician   6. make any legal or financial decisions  Call your doctor's office immediately, if:   1. increased and continuing rectal bleeding   2. fever   3. increased abdominal pain    Take it easy today and resume normal activity tomorrow.    Resume previous diet.

## 2021-08-21 NOTE — Anesthesia Pre-Procedure Evaluation (Signed)
Department of Anesthesiology  Preprocedure Note       Name:  Mark Castillo   Age:  31 y.o.  DOB:  03/01/1991                                          MRN:  [id number]         Date:  08/21/2021      Surgeon: Moishe Spice):  Dellis Anes, MD    Procedure: Procedure(s):  ESOPHAGOGASTRODUODENOSCOPY  COLONOSCOPY DIAGNOSTIC    Medications prior to admission:   Prior to Admission medications    Medication Sig Start Date End Date Taking? Authorizing Provider   carvedilol (COREG) 25 MG tablet Take 25 mg by mouth 2 times daily    Ar Automatic Reconciliation   vitamin D (CHOLECALCIFEROL) 25 MCG (1000 UT) TABS tablet Take 1,000 Units by mouth daily 03/11/19   Ar Automatic Reconciliation   cloNIDine (CATAPRES) 0.3 MG tablet Take 0.3 mg by mouth 2 times daily    Ar Automatic Reconciliation   famotidine (PEPCID) 20 MG tablet Take 20 mg by mouth daily 08/15/18   Ar Automatic Reconciliation   minoxidil (LONITEN) 2.5 MG tablet Take 2.5 mg by mouth 2 times daily    Ar Automatic Reconciliation   mycophenolate (MYFORTIC) 180 MG DR tablet Take 360 mg by mouth 4 times daily 09/08/18   Ar Automatic Reconciliation   predniSONE (DELTASONE) 20 MG tablet ceived the following from [facility]: Outside name: predniSONE (DELTASONE) 20 mg tablet 08/18/18   Ar Automatic Reconciliation   sodium bicarbonate 650 MG tablet Take 650 mg by mouth 3 times daily 09/01/18   Ar Automatic Reconciliation   sulfamethoxazole-trimethoprim (BACTRIM DS;SEPTRA DS) 800-160 MG per tablet Take 1 tablet by mouth 09/02/18   Ar Automatic Reconciliation   Tacrolimus ER 4 MG TB24 Take 12 mg by mouth 08/25/18   Ar Automatic Reconciliation       Current medications:    No current facility-administered medications for this encounter.       Allergies:    Allergies   Allergen Reactions   . Sevelamer Carbonate Other (See Comments) and Swelling     Swelling of lips and face (powdered version)  Powdered form only , made lips swell  Can take pill With no problems   .  Bromelains      Other reaction(s): Unknown (comments)       Problem List:  There is no problem list on file for this patient.      Past Medical History:        Diagnosis Date   . Asthma     childhood   . Chronic diastolic heart failure (HCC)    . ESRF (end stage renal failure) (HCC)     DIALYSIS MWF- [facility]   . Hematuria    . Hyperparathyroidism (HCC)    . Hypertension    . Hypertensive kidney disease with end-stage renal disease on dialysis (HCC)    . Hypoxia    . Kidney transplant recipient    . Seizure [facility])        Past Surgical History:        Procedure Laterality Date   . KIDNEY TRANSPLANT  08/11/2018    Living unrelated kidney transplant into right iliac fossa with preparation of living donor kidney   . UROLOGICAL SURGERY  KIDNEY BX   . VASCULAR SURGERY      AVF       Social History:    Social History     Tobacco Use   . Smoking status: Former     Packs/day: 0.10     Types: Cigarettes     Quit date: 10/19/2012     Years since quitting: 8.8   . Smokeless tobacco: Never   Substance Use Topics   . Alcohol use: Not Currently                                Counseling given: Not Answered      Vital Signs (Current):   Vitals:    08/21/21 1257   Pulse: 85   Resp: 18   Temp: 97.7 F (36.5 C)   TempSrc: Oral   SpO2: 97%   Weight: 215 lb 9.8 oz (97.8 kg)   Height: 6\' 2"  (1.88 m)                                              BP Readings from Last 3 Encounters:   No data found for BP       NPO Status:                                                                                 BMI:   Wt Readings from Last 3 Encounters:   08/21/21 215 lb 9.8 oz (97.8 kg)   04/18/21 210 lb (95.3 kg)   05/20/19 210 lb (95.3 kg)     Body mass index is 27.68 kg/m.    CBC:   Lab Results   Component Value Date/Time    HGB 13.3 06/30/2018 05:40 AM    HCT 39 06/30/2018 05:40 AM       CMP:   Lab Results   Component Value Date/Time    NA 138 06/30/2018 05:40 AM    K 4.8 06/30/2018 05:40 AM    CL 101 06/30/2018 05:40 AM    CO2 25  06/30/2018 05:40 AM    BUN 39 06/30/2018 05:40 AM    CREATININE 15.3 06/30/2018 05:40 AM    GLUCOSE 92 06/30/2018 05:40 AM       POC Tests: No results for input(s): POCGLU, POCNA, POCK, POCCL, POCBUN, POCHEMO, POCHCT in the last 72 hours.    Coags: No results found for: PROTIME, INR, APTT    HCG (If Applicable): No results found for: PREGTESTUR, PREGSERUM, HCG, HCGQUANT     ABGs: No results found for: PHART, PO2ART, PCO2ART, HCO3ART, BEART, O2SATART     Type & Screen (If Applicable):  No results found for: LABABO, LABRH    Drug/Infectious Status (If Applicable):  No results found for: HIV, HEPCAB    COVID-19 Screening (If Applicable): No results found for: COVID19        Anesthesia Evaluation  Patient summary reviewed and Nursing notes reviewed  Airway: Mallampati: III  TM distance: >3 FB   Neck ROM: full  Mouth opening: > = 3 FB   Dental: normal exam         Pulmonary:normal exam    (+) asthma:                            Cardiovascular:    (+) hypertension:,                   Neuro/Psych:   Negative Neuro/Psych ROS              GI/Hepatic/Renal: Neg GI/Hepatic/Renal ROS  (+) renal disease: dialysis and ESRD,           Endo/Other: Negative Endo/Other ROS                    Abdominal: normal exam            Vascular: negative vascular ROS.         Other Findings:           Anesthesia Plan      general     ASA 3       Induction: intravenous.      Anesthetic plan and risks discussed with patient.      Plan discussed with CRNA.                    Alla GermanSHAO Bristal Steffy, MD   08/21/2021

## 2021-08-21 NOTE — H&P (Signed)
History and Physical    Mark Castillo  11/15/1990  [id number]  [id number]    Pre-Procedure Diagnosis:  Iron deficiency anemia due to chronic blood loss [D50.0]      Evaluation of past illnesses, surgeries, or injuries:   Yes  Past Medical History:   Diagnosis Date    Asthma     childhood    Chronic diastolic heart failure (HCC)     ESRF (end stage renal failure) (HCC)     DIALYSIS MWF- [facility]    Hematuria     Hyperparathyroidism (HCC)     Hypertension     Hypertensive kidney disease with end-stage renal disease on dialysis [facility])     Hypoxia     Kidney transplant recipient     Seizure [facility])      Past Surgical History:   Procedure Laterality Date    KIDNEY TRANSPLANT  08/11/2018    Living unrelated kidney transplant into right iliac fossa with preparation of living donor kidney    UROLOGICAL SURGERY      KIDNEY BX    VASCULAR SURGERY      AVF       Allergies:    Allergies   Allergen Reactions    Sevelamer Carbonate Other (See Comments) and Swelling     Swelling of lips and face (powdered version)  Powdered form only , made lips swell  Can take pill  With no problems    Bromelains      Other reaction(s): Unknown (comments)       Previous reactions to sedation/analgesia?  No    Review of current medications, supplement, herbals and nutraceuticals complete:  Yes  No current facility-administered medications for this encounter.     Facility-Administered Medications Ordered in Other Encounters   Medication Dose Route Frequency Provider Last Rate Last Admin    0.9 % sodium chloride infusion   IntraVENous Continuous PRN Trilby Leaver, APRN - CRNA   New Bag at 08/21/21 1334          Pertinent labs reviewed?  Yes    History of substance abuse?  No  Family History   Problem Relation Age of Onset    Heart Attack Father     Hypertension Mother     Arthritis Mother     Hypertension Paternal Grandfather      Social History     Socioeconomic History    Marital status: Single     Spouse name: Not on file    Number of  children: Not on file    Years of education: Not on file    Highest education level: Not on file   Occupational History    Not on file   Tobacco Use    Smoking status: Former     Packs/day: 0.10     Types: Cigarettes     Quit date: 10/19/2012     Years since quitting: 8.8    Smokeless tobacco: Never   Substance and Sexual Activity    Alcohol use: Not Currently    Drug use: Yes     Types: Marijuana [facility] Fava)    Sexual activity: Not on file   Other Topics Concern    Not on file   Social History Narrative    Not on file     Social Determinants of Health     Financial Resource Strain: Not on file   Food Insecurity: Not on file   Transportation Needs: Not on file   Physical Activity: Not  on file   Stress: Not on file   Social Connections: Not on file   Intimate Partner Violence: Not on file   Housing Stability: Not on file       Cardiac Status:  WNL    Mental Status:  WNL    Pulmonary Status:  WNL    NPO:  5-8    Assessment/Impression: IDA    Plan of treatment: EGD and colonoscopy.         Mark Dolly Sherryl Valido, MD  08/21/2021  1:46 PM

## 2021-08-21 NOTE — Progress Notes (Signed)
Discharge caregiver and ride:    Mark Castillo, mother: [phone]

## 2021-08-21 NOTE — Anesthesia Post-Procedure Evaluation (Signed)
Department of Anesthesiology  Postprocedure Note    Patient: Mark Castillo  MRN: [id number]  Birthdate: March 02, 1991  Date of evaluation: 08/21/2021      Procedure Summary     Date: 08/21/21 Room / Location: [facility] ENDO 03 / [facility] ENDOSCOPY    Anesthesia Start: 1345 Anesthesia Stop: 1448    Procedures:       ESOPHAGOGASTRODUODENOSCOPY w/BX (Upper GI Region)      COLONOSCOPY DIAGNOSTIC (Lower GI Region) Diagnosis:       Iron deficiency anemia due to chronic blood loss      (Iron deficiency anemia due to chronic blood loss [D50.0])    Surgeons: Dellis Anes, MD Responsible Provider: Wilder Glade, MD    Anesthesia Type: TIVA, General ASA Status: 3          Anesthesia Type: TIVA, General    Aldrete Phase I:      Aldrete Phase II: Aldrete Score: 10      Anesthesia Post Evaluation    Patient location during evaluation: PACU  Patient participation: complete - patient participated  Level of consciousness: awake  Pain score: 1  Airway patency: patent  Nausea & Vomiting: no nausea and no vomiting  Complications: no  Cardiovascular status: hemodynamically stable  Respiratory status: acceptable  Hydration status: euvolemic  Multimodal analgesia pain management approach

## 2023-02-04 LAB — HEMOGLOBIN A1C
Estimated Avg Glucose, External: 106 mg/dL (ref 91–123)
Hemoglobin A1C, External: 5.3 % (ref 4.8–5.6)

## 2023-07-14 ENCOUNTER — Inpatient Hospital Stay: Admit: 2023-07-14

## 2023-07-15 LAB — POTASSIUM: Potassium: 4.8 mmol/L (ref 3.5–5.5)

## 2023-07-18 ENCOUNTER — Inpatient Hospital Stay: Admit: 2023-07-21

## 2023-07-21 LAB — BUN
BUN: 60 mg/dL — ABNORMAL HIGH (ref 7.0–18)
BUN: 27 mg/dL — ABNORMAL HIGH (ref 7.0–18)
